# Patient Record
Sex: Male | Born: 1980 | Race: White | Hispanic: No | Marital: Married | Smoking: Never smoker
Health system: Southern US, Community
[De-identification: ages and names within clinical notes are randomized; demographics above are authoritative.]

## PROBLEM LIST (undated history)

## (undated) DIAGNOSIS — S83105A Unspecified dislocation of left knee, initial encounter: Secondary | ICD-10-CM

## (undated) DIAGNOSIS — F418 Other specified anxiety disorders: Secondary | ICD-10-CM

## (undated) DIAGNOSIS — E669 Obesity, unspecified: Secondary | ICD-10-CM

## (undated) HISTORY — DX: Obesity, unspecified: E66.9

## (undated) HISTORY — DX: Other specified anxiety disorders: F41.8

## (undated) HISTORY — DX: Unspecified dislocation of left knee, initial encounter: S83.105A

## (undated) HISTORY — PX: TONSILLECTOMY: SUR1361

---
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
## 2002-10-04 ENCOUNTER — Emergency Department (HOSPITAL_COMMUNITY): Admission: EM | Admit: 2002-10-04 | Discharge: 2002-10-04 | Payer: Self-pay | Admitting: Emergency Medicine

## 2005-10-16 ENCOUNTER — Emergency Department (HOSPITAL_COMMUNITY): Admission: EM | Admit: 2005-10-16 | Discharge: 2005-10-17 | Payer: Self-pay | Admitting: Emergency Medicine

## 2006-11-19 ENCOUNTER — Emergency Department (HOSPITAL_COMMUNITY): Admission: EM | Admit: 2006-11-19 | Discharge: 2006-11-19 | Payer: Self-pay | Admitting: Emergency Medicine

## 2007-02-03 ENCOUNTER — Emergency Department (HOSPITAL_COMMUNITY): Admission: EM | Admit: 2007-02-03 | Discharge: 2007-02-03 | Payer: Self-pay | Admitting: Family Medicine

## 2009-02-09 ENCOUNTER — Emergency Department (HOSPITAL_COMMUNITY): Admission: EM | Admit: 2009-02-09 | Discharge: 2009-02-09 | Payer: Self-pay | Admitting: Emergency Medicine

## 2012-10-12 ENCOUNTER — Emergency Department (HOSPITAL_COMMUNITY): Payer: BC Managed Care – PPO

## 2012-10-12 ENCOUNTER — Encounter (HOSPITAL_COMMUNITY): Payer: Self-pay | Admitting: Nurse Practitioner

## 2012-10-12 ENCOUNTER — Emergency Department (HOSPITAL_COMMUNITY)
Admission: EM | Admit: 2012-10-12 | Discharge: 2012-10-12 | Disposition: A | Discharge status: Home / Self Care | Payer: BC Managed Care – PPO | Attending: Emergency Medicine | Admitting: Emergency Medicine

## 2012-10-12 DIAGNOSIS — K6289 Other specified diseases of anus and rectum: Secondary | ICD-10-CM | POA: Insufficient documentation

## 2012-10-12 DIAGNOSIS — R1032 Left lower quadrant pain: Secondary | ICD-10-CM

## 2012-10-12 DIAGNOSIS — Z881 Allergy status to other antibiotic agents status: Secondary | ICD-10-CM | POA: Insufficient documentation

## 2012-10-12 DIAGNOSIS — K625 Hemorrhage of anus and rectum: Secondary | ICD-10-CM | POA: Insufficient documentation

## 2012-10-12 LAB — URINALYSIS, ROUTINE W REFLEX MICROSCOPIC
Bilirubin Urine: NEGATIVE
Hgb urine dipstick: NEGATIVE
Ketones, ur: NEGATIVE mg/dL
Protein, ur: NEGATIVE mg/dL
Urobilinogen, UA: 0.2 mg/dL (ref 0.0–1.0)

## 2012-10-12 LAB — CBC WITH DIFFERENTIAL/PLATELET
Eosinophils Relative: 2 % (ref 0–5)
Hemoglobin: 16.7 g/dL (ref 13.0–17.0)
Lymphocytes Relative: 37 % (ref 12–46)
Lymphs Abs: 3.3 10*3/uL (ref 0.7–4.0)
MCH: 31.8 pg (ref 26.0–34.0)
MCV: 88 fL (ref 78.0–100.0)
Monocytes Relative: 8 % (ref 3–12)
Platelets: 259 10*3/uL (ref 150–400)
RBC: 5.25 MIL/uL (ref 4.22–5.81)
WBC: 8.9 10*3/uL (ref 4.0–10.5)

## 2012-10-12 LAB — COMPREHENSIVE METABOLIC PANEL
ALT: 23 U/L (ref 0–53)
Alkaline Phosphatase: 74 U/L (ref 39–117)
BUN: 14 mg/dL (ref 6–23)
CO2: 25 mEq/L (ref 19–32)
Calcium: 9.5 mg/dL (ref 8.4–10.5)
GFR calc Af Amer: >90 mL/min (ref >90)
GFR calc non Af Amer: >90 mL/min (ref >90)
Glucose, Bld: 109 mg/dL — ABNORMAL HIGH (ref 70–99)
Potassium: 3.8 mEq/L (ref 3.5–5.1)
Sodium: 136 mEq/L (ref 135–145)

## 2012-10-12 LAB — LIPASE, BLOOD: Lipase: 20 U/L (ref 11–59)

## 2012-10-12 MED ORDER — HYDROCODONE-ACETAMINOPHEN 5-325 MG PO TABS: 1.0000 | ORAL_TABLET | Freq: Four times a day (QID) | ORAL | Status: DC | PRN

## 2012-10-12 MED ORDER — IOHEXOL 300 MG/ML  SOLN
100.0000 mL | Freq: Once | INTRAMUSCULAR | Status: AC | PRN
  Administered 2012-10-12: 100 mL via INTRAVENOUS

## 2012-10-12 MED ORDER — ONDANSETRON HCL 4 MG PO TABS: 4.0000 mg | ORAL_TABLET | Freq: Three times a day (TID) | ORAL | Status: DC | PRN

## 2012-10-12 NOTE — ED Provider Notes (Signed)
I saw and evaluated the patient, reviewed the resident's note and I agree with the findings and plan.   .Face to face Exam:  General:  Awake HEENT:  Atraumatic Resp:  Normal effort Abd:  Nondistended Neuro:No focal weakness  Nelia Shi, MD 10/12/12 [phone]

## 2012-10-12 NOTE — ED Provider Notes (Signed)
CSN: [id number]     Arrival date & time 10/12/12  1150 History     First MD Initiated Contact with Patient 10/12/12 1152     Chief Complaint  Patient presents with  . Rectal Bleeding   HPI 32 year old male presents with reports of blood stool and associated abdominal pain.  He reports that last night around 2 am, who woke up with moderately severe left lower abdominal pain.  This slowly improved and he went back to sleep.  When he got up again, he used the restroom and noticed dark red blood in the toilet.  Concerned, he decide to go to the ED for further evaluation.  Abdominal pain is located in the LLQ and is non radiating. He denies any exacerbating or relieving factors.  No associated constipation, diarrhea, N/V, chest pain or SOB.  Denies any prior history of abdominal pain and associated rectal bleeding.  Denies any changes in his diet or new medications.  He also denies any family history of UC or Crohn's disease.  History reviewed. No pertinent past medical history. Past Surgical History  Procedure Laterality Date  . Replacement total knee Left 1997   History reviewed. No pertinent family history. History  Substance Use Topics  . Smoking status: Never Smoker   . Smokeless tobacco: Not on file  . Alcohol Use: Yes    Review of Systems  Constitutional: Negative for fever, chills and appetite change.  Respiratory: Negative for shortness of breath.   Cardiovascular: Negative for chest pain.  Gastrointestinal: Positive for abdominal pain, blood in stool and rectal pain. Negative for nausea, vomiting, diarrhea, constipation and abdominal distention.  Neurological: Negative for dizziness and light-headedness.    Allergies  Ceclor  Home Medications  No current outpatient prescriptions on file. BP 144/86  Pulse 88  Temp(Src) 98.1 F (36.7 C) (Oral)  Resp 18  SpO2 97% Physical Exam  Constitutional: He is oriented to person, place, and time. He appears well-developed and  well-nourished.  HENT:  Head: Normocephalic and atraumatic.  Cardiovascular: Normal rate and regular rhythm.   No murmur heard. Pulmonary/Chest: Effort normal. He has no wheezes. He has no rales.  Abdominal: Soft. Bowel sounds are normal. He exhibits no distension and no mass. There is tenderness. There is no rebound and no guarding.  Mildly Tender to palpation LLQ  Genitourinary:  Rectal exam: No hemorrhoids noted. Perianal erythema with a few small areas of ulceration noted.  Stool Guaiac obtained.   Musculoskeletal: He exhibits no edema.  Neurological: He is alert and oriented to person, place, and time.  Skin: Skin is warm and dry.    ED Course   Procedures (including critical care time)  CBC    Component Value Date/Time   WBC 8.9 10/12/2012 1217   RBC 5.25 10/12/2012 1217   HGB 16.7 10/12/2012 1217   HCT 46.2 10/12/2012 1217   PLT 259 10/12/2012 1217   MCV 88.0 10/12/2012 1217   MCH 31.8 10/12/2012 1217   MCHC 36.1* 10/12/2012 1217   RDW 12.9 10/12/2012 1217   LYMPHSABS 3.3 10/12/2012 1217   MONOABS 0.7 10/12/2012 1217   EOSABS 0.1 10/12/2012 1217   BASOSABS 0.0 10/12/2012 1217   CMP     Component Value Date/Time   NA 136 10/12/2012 1217   K 3.8 10/12/2012 1217   CL 100 10/12/2012 1217   CO2 25 10/12/2012 1217   GLUCOSE 109* 10/12/2012 1217   BUN 14 10/12/2012 1217   CREATININE 0.88 10/12/2012 1217  CALCIUM 9.5 10/12/2012 1217   PROT 7.9 10/12/2012 1217   ALBUMIN 4.2 10/12/2012 1217   AST 19 10/12/2012 1217   ALT 23 10/12/2012 1217   ALKPHOS 74 10/12/2012 1217   BILITOT 1.3* 10/12/2012 1217   GFRNONAA >90 10/12/2012 1217   GFRAA >90 10/12/2012 1217   Lipase     Component Value Date/Time   LIPASE 20 10/12/2012 1217   Ct Abdomen Pelvis W Contrast 10/12/2012   *RADIOLOGY REPORT*  Clinical Data: Rectal bleeding, abdominal pain, constipation  CT ABDOMEN AND PELVIS WITH CONTRAST  Technique:  Multidetector CT imaging of the abdomen and pelvis was performed following the standard protocol during bolus administration  of intravenous contrast.  Contrast: OMNIPAQUE IOHEXOL 300 MG/ML  SOLN  Comparison: None.  Findings: Lung bases are clear.  Liver is notable for focal fat/altered perfusion along the falciform ligament.  Spleen, pancreas, and adrenal glands within normal limits.  Gallbladder is unremarkable.  No intrahepatic or extrahepatic ductal dilatation.  Kidneys are within normal limits.  No hydronephrosis.  No evidence of bowel obstruction.  Normal appendix.  No colonic wall thickening or inflammatory changes.  No evidence of abdominal aortic aneurysm.  No abdominopelvic ascites.  No suspicious abdominopelvic lymphadenopathy.  Prostate is unremarkable.  Bladder is within normal limits.  Visualized osseous structures are within normal limits.  IMPRESSION: No evidence of bowel obstruction.  Normal appendix.  No colonic wall thickening or inflammatory changes.  No CT findings to account for the patient's GI bleeding.    No results found. No diagnosis found.  MDM  32 year old male presents with LLQ and recent hematochezia. - Hemacult Negative. CBC was unremarkable with normal H/H.  CMP unremarkable as well.  - CT abdomen/pelvis obtained; No findings to account for patient's GI bleeding (see report above). - Patient stable.  Will discharge home with Zofran and PRN Vicodin.  Patient to follow up with [facility].   Tommie Sams, DO 10/12/12 1400

## 2012-10-12 NOTE — ED Notes (Signed)
Pt reports abd cramping throughout the night last night, then this morning when he got up to have a bowel movement he noticed dark red blood in toilet with his stool. Reports a history of constipation but has not recently been constipated. Denies n/v.

## 2012-10-12 NOTE — Discharge Instructions (Signed)
Please call [facility] and set up a follow up appointment.   Abdominal Pain Abdominal pain can be caused by many things. Your caregiver decides the seriousness of your pain by an examination and possibly blood tests and X-rays. Many cases can be observed and treated at home. Most abdominal pain is not caused by a disease and will probably improve without treatment. However, in many cases, more time must pass before a clear cause of the pain can be found. Before that point, it may not be known if you need more testing, or if hospitalization or surgery is needed. HOME CARE INSTRUCTIONS   Do not take laxatives unless directed by your caregiver.  Take pain medicine only as directed by your caregiver.  Only take over-the-counter or prescription medicines for pain, discomfort, or fever as directed by your caregiver.  Try a clear liquid diet (broth, tea, or water) for as long as directed by your caregiver. Slowly move to a bland diet as tolerated. SEEK IMMEDIATE MEDICAL CARE IF:   The pain does not go away.  You have a fever.  You keep throwing up (vomiting).  The pain is felt only in portions of the abdomen. Pain in the right side could possibly be appendicitis. In an adult, pain in the left lower portion of the abdomen could be colitis or diverticulitis.  You pass bloody or black tarry stools. MAKE SURE YOU:   Understand these instructions.  Will watch your condition.  Will get help right away if you are not doing well or get worse. Document Released: 12/06/2004 Document Revised: 05/21/2011 Document Reviewed: 10/15/2007 [facility] Patient Information 2014 [place], [place].  Bloody Stools Bloody stools often mean that there is a problem in the digestive tract. Your caregiver may use the term "melena" to describe black, tarry, and bad smelling stools or "hematochezia" to describe red or maroon-colored stools. Blood seen in the stool can be caused by bleeding anywhere along the  intestinal tract.  A black stool usually means that blood is coming from the upper part of the gastrointestinal tract (esophagus, stomach, or small bowel). Passing maroon-colored stools or bright red blood usually means that blood is coming from lower down in the large bowel or the rectum. However, sometimes massive bleeding in the stomach or small intestine can cause bright red bloody stools.  Consuming black licorice, lead, iron pills, medicines containing bismuth subsalicylate, or blueberries can also cause black stools. Your caregiver can test black stools to see if blood is present. It is important that the cause of the bleeding be found. Treatment can then be started, and the problem can be corrected. Rectal bleeding may not be serious, but you should not assume everything is okay until you know the cause.It is very important to follow up with your caregiver or a specialist in gastrointestinal problems. CAUSES  Blood in the stools can come from various underlying causes.Often, the cause is not found during your first visit. Testing is often needed to discover the cause of bleeding in the gastrointestinal tract. Causes range from simple to serious or even life-threatening.Possible causes include:  Hemorrhoids.These are veins that are full of blood (engorged) in the rectum. They cause pain, inflammation, and may bleed.  Anal fissures.These are areas of painful tearing which may bleed. They are often caused by passing hard stool.  Diverticulosis.These are pouches that form on the colon over time, with age, and may bleed significantly.  Diverticulitis.This is inflammation in areas with diverticulosis. It can cause pain, fever,  and bloody stools, although bleeding is rare.  Proctitis and colitis. These are inflamed areas of the rectum or colon. They may cause pain, fever, and bloody stools.  Polyps and cancer. Colon cancer is a leading cause of preventable cancer death.It often starts out  as precancerous polyps that can be removed during a colonoscopy, preventing progression into cancer. Sometimes, polyps and cancer may cause rectal bleeding.  Gastritis and ulcers.Bleeding from the upper gastrointestinal tract (near the stomach) may travel through the intestines and produce black, sometimes tarry, often bad smelling stools. In certain cases, if the bleeding is fast enough, the stools may not be black, but red and the condition may be life-threatening. SYMPTOMS  You may have stools that are bright red and bloody, that are normal color with blood on them, or that are dark black and tarry. In some cases, you may only have blood in the toilet bowl. Any of these cases need medical care. You may also have:  Pain at the anus or anywhere in the rectum.  Lightheadedness or feeling faint.  Extreme weakness.  Nausea or vomiting.  Fever. DIAGNOSIS Your caregiver may use the following methods to find the cause of your bleeding:  Taking a medical history. Age is important. Older people tend to develop polyps and cancer more often. If there is anal pain and a hard, large stool associated with bleeding, a tear of the anus may be the cause. If blood drips into the toilet after a bowel movement, bleeding hemorrhoids may be the problem. The color and frequency of the bleeding are additional considerations. In most cases, the medical history provides clues, but seldom the final answer.  A visual and finger (digital) exam. Your caregiver will inspect the anal area, looking for tears and hemorrhoids. A finger exam can provide information when there is tenderness or a growth inside. In men, the prostate is also examined.  Endoscopy. Several types of small, long scopes (endoscopes) are used to view the colon.  In the office, your caregiver may use a rigid, or more commonly, a flexible viewing sigmoidoscope. This exam is called flexible sigmoidoscopy. It is performed in 5 to 10 minutes.  A more  thorough exam is accomplished with a colonoscope. It allows your caregiver to view the entire 5 to 6 foot long colon. Medicine to help you relax (sedative) is usually given for this exam. Frequently, a bleeding lesion may be present beyond the reach of the sigmoidoscope. So, a colonoscopy may be the best exam to start with. Both exams are usually done on an outpatient basis. This means the patient does not stay overnight in the hospital or surgery center.  An upper endoscopy may be needed to examine your stomach. Sedation is used and a flexible endoscope is put in your mouth, down to your stomach.  A barium enema X-ray. This is an X-ray exam. It uses liquid barium inserted by enema into the rectum. This test alone may not identify an actual bleeding point. X-rays highlight abnormal shadows, such as those made by lumps (tumors), diverticuli, or colitis. TREATMENT  Treatment depends on the cause of your bleeding.   For bleeding from the stomach or colon, the caregiver doing your endoscopy or colonoscopy may be able to stop the bleeding as part of the procedure.  Inflammation or infection of the colon can be treated with medicines.  Many rectal problems can be treated with creams, suppositories, or warm baths.  Surgery is sometimes needed.  Blood transfusions are sometimes needed  if you have lost a lot of blood.  For any bleeding problem, let your caregiver know if you take aspirin or other blood thinners regularly. HOME CARE INSTRUCTIONS   Take any medicines exactly as prescribed.  Keep your stools soft by eating a diet high in fiber. Prunes (1 to 3 a day) work well for many people.  Drink enough water and fluids to keep your urine clear or pale yellow.  Take sitz baths if advised. A sitz bath is when you sit in a bathtub with warm water for 10 to 15 minutes to soak, soothe, and cleanse the rectal area.  If enemas or suppositories are advised, be sure you know how to use them. Tell your  caregiver if you have problems with this.  Monitor your bowel movements to look for signs of improvement or worsening. SEEK MEDICAL CARE IF:   You do not improve in the time expected.  Your condition worsens after initial improvement.  You develop any new symptoms. SEEK IMMEDIATE MEDICAL CARE IF:   You develop severe or prolonged rectal bleeding.  You vomit blood.  You feel weak or faint.  You have a fever. MAKE SURE YOU:  Understand these instructions.  Will watch your condition.  Will get help right away if you are not doing well or get worse. Document Released: 02/16/2002 Document Revised: 05/21/2011 Document Reviewed: 07/14/2010 [facility] Patient Information 2014 [place], [place].

## 2012-10-12 NOTE — ED Notes (Signed)
Pt sts around 2am he woke up with a really bad stomach ache, had abd pain the rest of the night, felt constipated. This morning he drank some coffee and then used the restroom, notice the toliet bowl with filled with dark red bld. Pt sts now the abd pain has return, sts it is in the left lower quadrant. Sts no hx of diverticulosis/rectal bleeding. Pt in nad, skin warm and dry, resp e/u.

## 2015-03-13 DIAGNOSIS — E559 Vitamin D deficiency, unspecified: Secondary | ICD-10-CM

## 2015-03-13 HISTORY — DX: Vitamin D deficiency, unspecified: E55.9

## 2015-03-30 IMAGING — CT CT ABD-PELV W/ CM
2 of 4 series · 16 of 46 positions shown, 18 images · IV contrast (omnipaque)
Comparison: None.

CLINICAL DATA: Rectal bleeding, abdominal pain, constipation

CT ABDOMEN AND PELVIS WITH CONTRAST
TECHNIQUE: Multidetector CT imaging of the abdomen and pelvis was
performed following the standard protocol during bolus
administration of intravenous contrast.
Contrast: 100mL OMNIPAQUE IOHEXOL 300 MG/ML  SOLN

[Series 2: abd/ pelvis 5.0 i30f 1 · axial · 0.98mm/px · z∈[+878,+1353]mm · 13 of 105 slices shown, 15 images]
[im 5/105  soft-tissue]
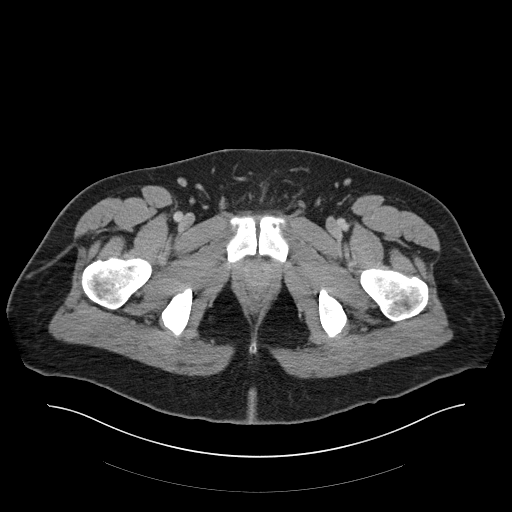
[im 5/105  bone]
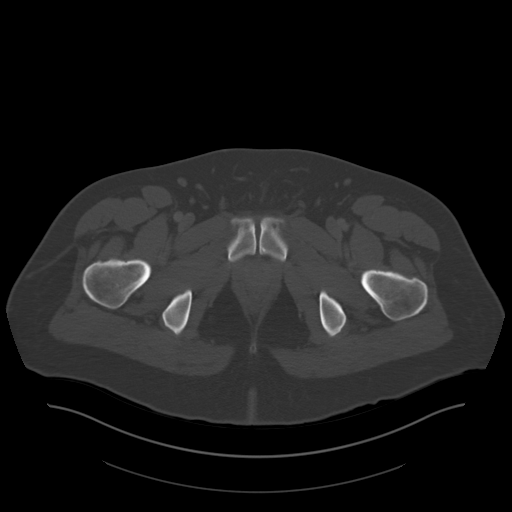
[im 15/105  soft-tissue]
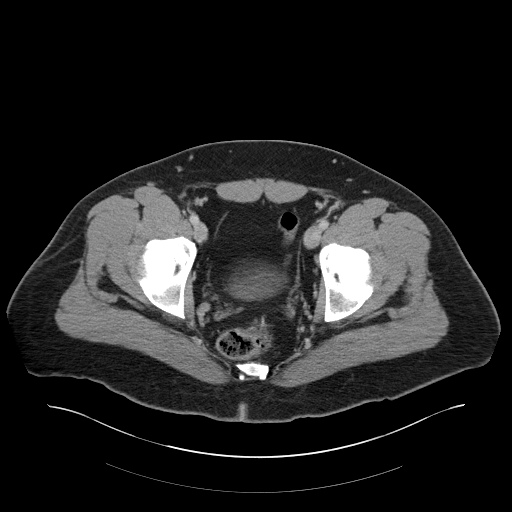
[im 24/105  soft-tissue]
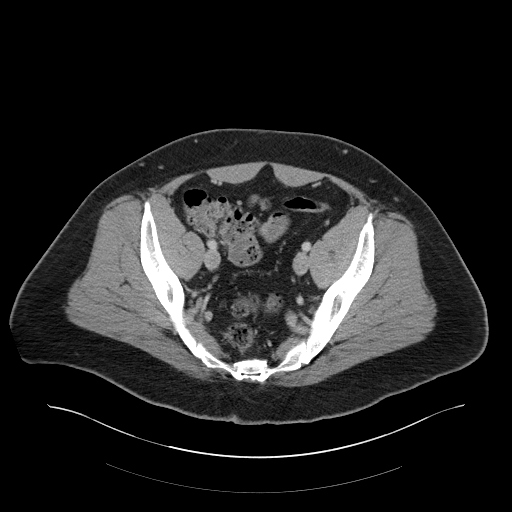
[im 29/105  soft-tissue]
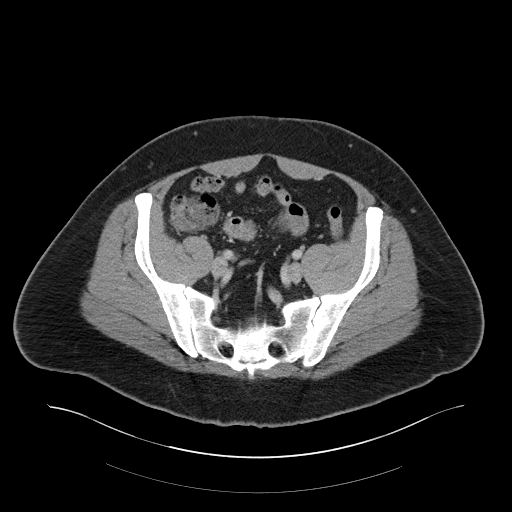
[im 38/105  soft-tissue]
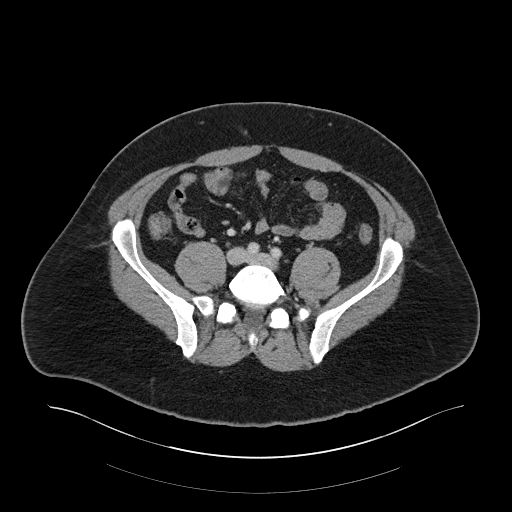
[im 43/105  soft-tissue]
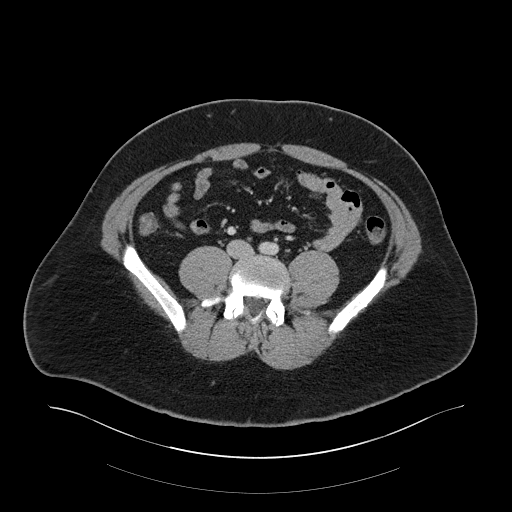
[im 53/105  soft-tissue]
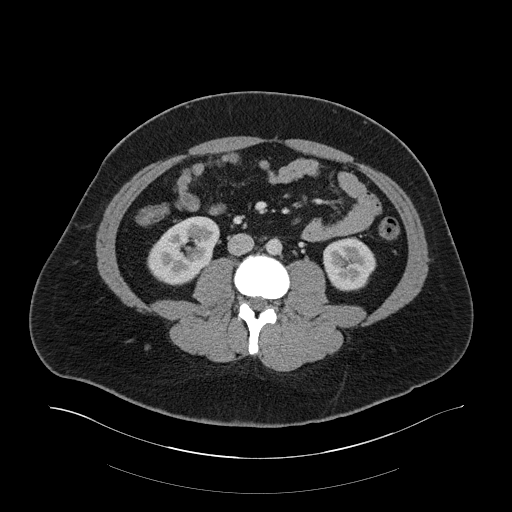
[im 62/105  soft-tissue]
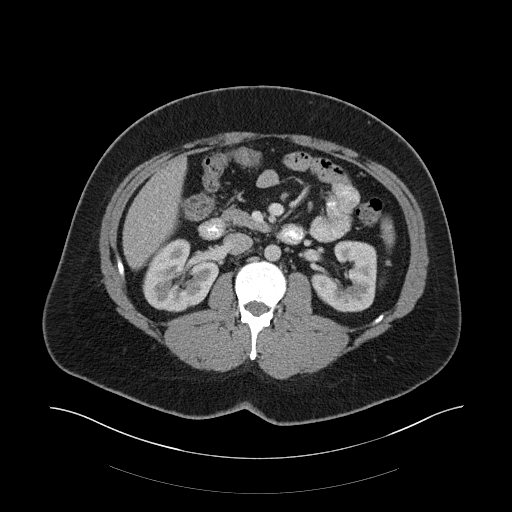
[im 67/105  soft-tissue]
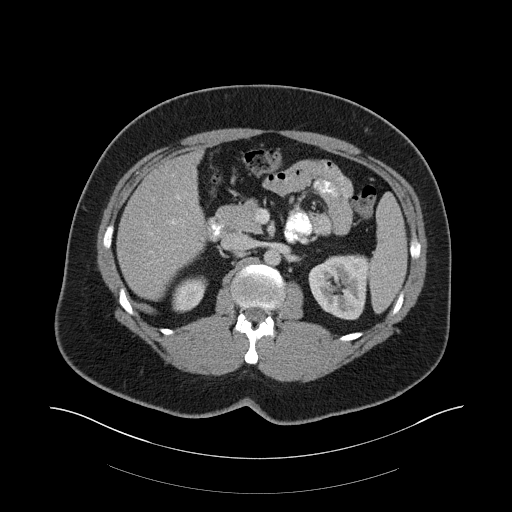
[im 67/105  bone]
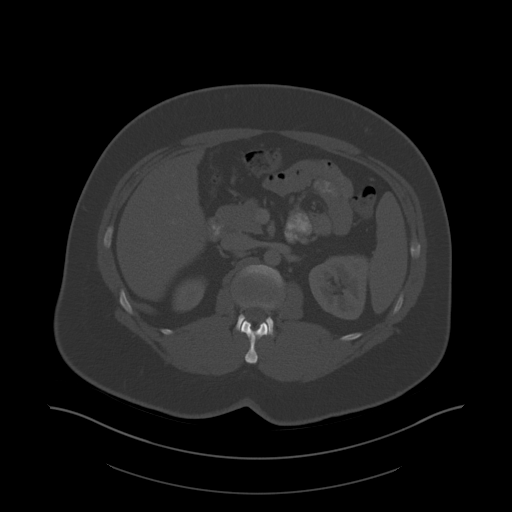
[im 76/105  soft-tissue]
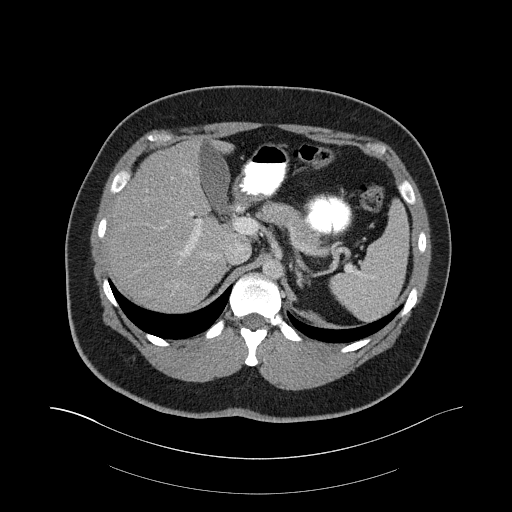
[im 81/105  soft-tissue]
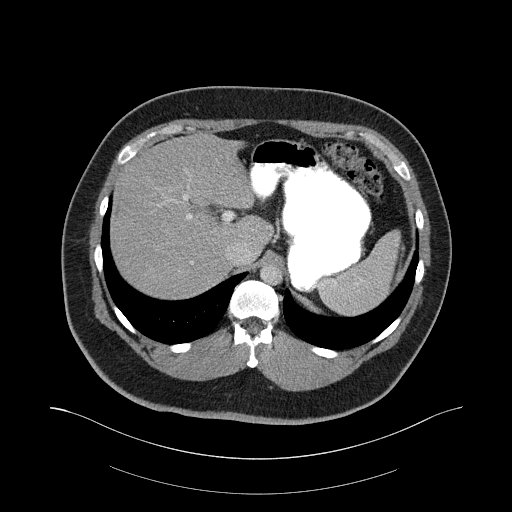
[im 90/105  soft-tissue]
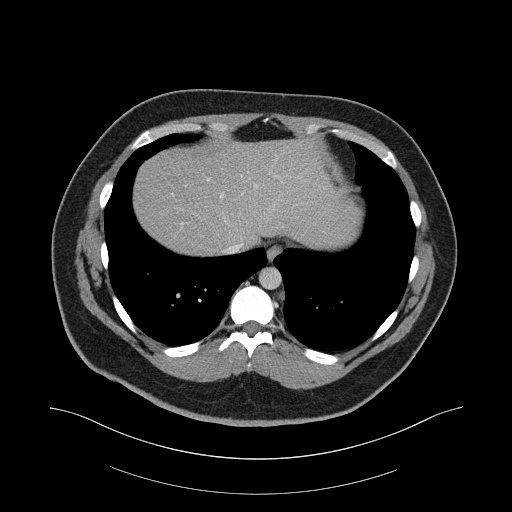
[im 100/105  soft-tissue]
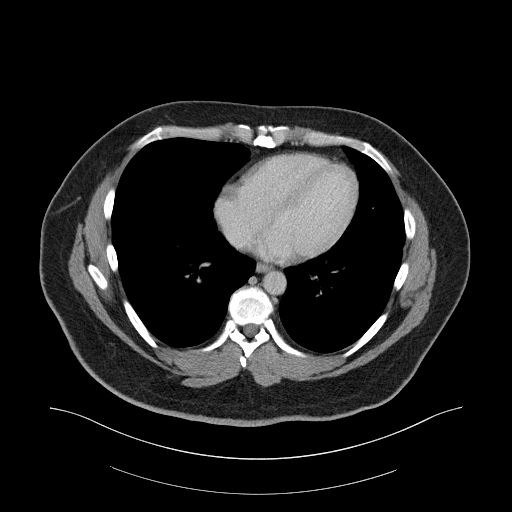

[Series 5: cor · coronal · 0.68mm/px · 3 of 120 slices shown]
[im 40/120  soft-tissue]
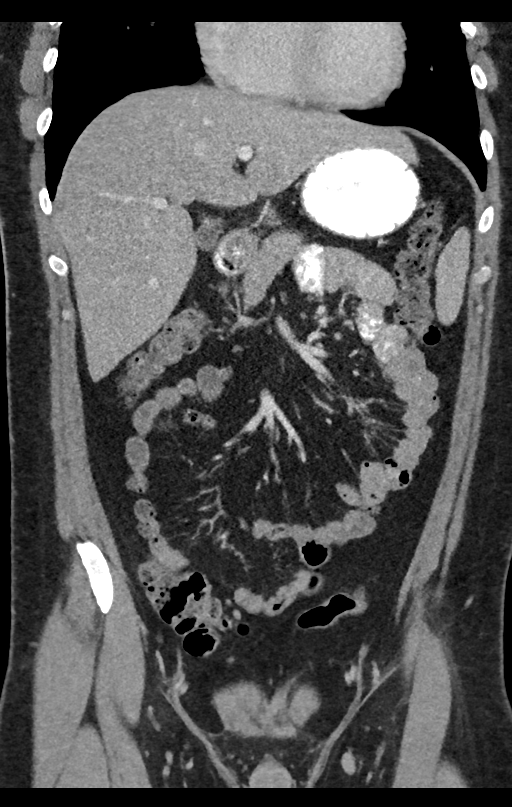
[im 53/120  soft-tissue]
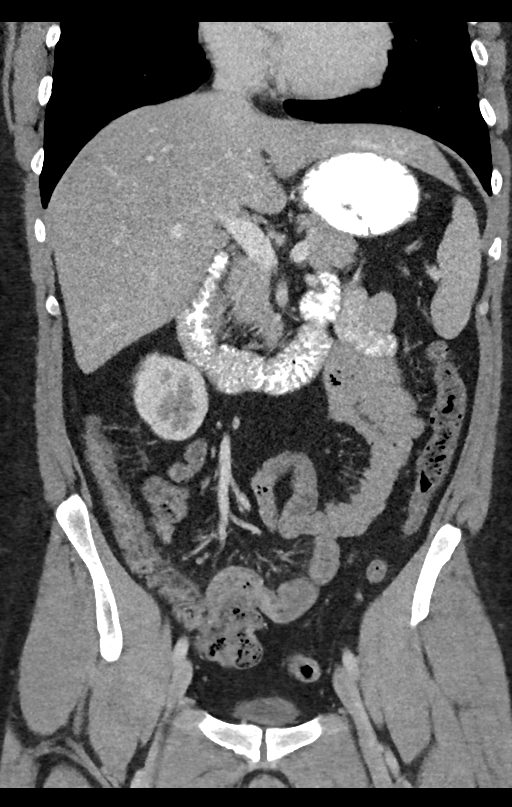
[im 67/120  soft-tissue]
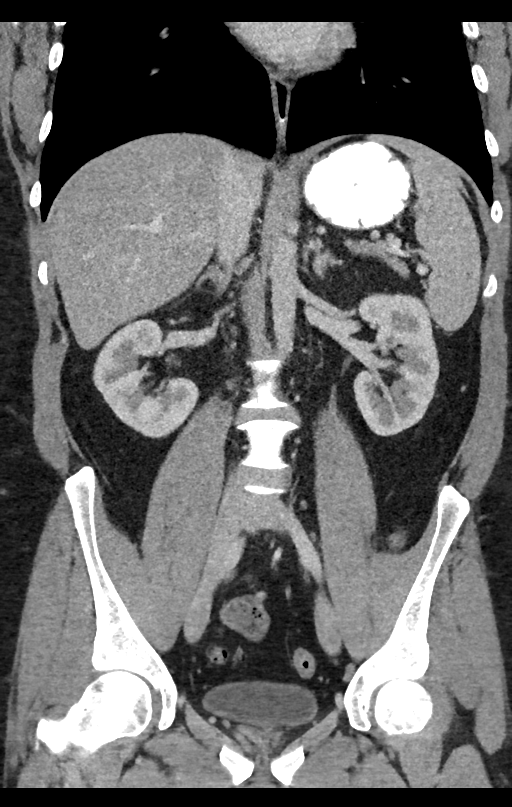

[16 of 46 positions shown; findings below may reference images not displayed]

FINDINGS: Lung bases are clear.

Liver is notable for focal fat/altered perfusion along the
falciform ligament.

Spleen, pancreas, and adrenal glands within normal limits.

Gallbladder is unremarkable.  No intrahepatic or extrahepatic
ductal dilatation.

Kidneys are within normal limits.  No hydronephrosis.

No evidence of bowel obstruction.  Normal appendix.  No colonic
wall thickening or inflammatory changes.

No evidence of abdominal aortic aneurysm.

No abdominopelvic ascites.

No suspicious abdominopelvic lymphadenopathy.

Prostate is unremarkable.

Bladder is within normal limits.

Visualized osseous structures are within normal limits.
IMPRESSION: No evidence of bowel obstruction.  Normal appendix.

No colonic wall thickening or inflammatory changes.

No CT findings to account for the patient's GI bleeding.

## 2015-06-20 DIAGNOSIS — H5213 Myopia, bilateral: Secondary | ICD-10-CM | POA: Diagnosis not present

## 2016-04-12 DIAGNOSIS — E559 Vitamin D deficiency, unspecified: Secondary | ICD-10-CM | POA: Diagnosis not present

## 2016-04-12 DIAGNOSIS — R8299 Other abnormal findings in urine: Secondary | ICD-10-CM | POA: Diagnosis not present

## 2016-04-12 DIAGNOSIS — Z0000 Encounter for general adult medical examination without abnormal findings: Secondary | ICD-10-CM | POA: Diagnosis not present

## 2016-04-17 DIAGNOSIS — F329 Major depressive disorder, single episode, unspecified: Secondary | ICD-10-CM | POA: Diagnosis not present

## 2016-04-17 DIAGNOSIS — Z1389 Encounter for screening for other disorder: Secondary | ICD-10-CM | POA: Diagnosis not present

## 2016-04-17 DIAGNOSIS — E668 Other obesity: Secondary | ICD-10-CM | POA: Diagnosis not present

## 2016-04-17 DIAGNOSIS — R5383 Other fatigue: Secondary | ICD-10-CM | POA: Diagnosis not present

## 2016-04-17 DIAGNOSIS — Z0000 Encounter for general adult medical examination without abnormal findings: Secondary | ICD-10-CM | POA: Diagnosis not present

## 2016-04-17 DIAGNOSIS — E559 Vitamin D deficiency, unspecified: Secondary | ICD-10-CM | POA: Diagnosis not present

## 2017-02-27 DIAGNOSIS — R05 Cough: Secondary | ICD-10-CM | POA: Diagnosis not present

## 2017-02-27 DIAGNOSIS — J029 Acute pharyngitis, unspecified: Secondary | ICD-10-CM | POA: Diagnosis not present

## 2017-02-27 DIAGNOSIS — J069 Acute upper respiratory infection, unspecified: Secondary | ICD-10-CM | POA: Diagnosis not present

## 2017-03-03 ENCOUNTER — Ambulatory Visit: Payer: Self-pay | Admitting: Nurse Practitioner

## 2017-03-03 VITALS — BP 126/84 | HR 78 | Temp 98.5°F | Resp 18

## 2017-03-03 DIAGNOSIS — J0100 Acute maxillary sinusitis, unspecified: Secondary | ICD-10-CM

## 2017-03-03 DIAGNOSIS — J209 Acute bronchitis, unspecified: Secondary | ICD-10-CM

## 2017-03-03 MED ORDER — DOXYCYCLINE HYCLATE 100 MG PO TABS: 100.0000 mg | ORAL_TABLET | Freq: Two times a day (BID) | ORAL | 0 refills | Status: AC

## 2017-03-03 MED ORDER — PREDNISONE 20 MG PO TABS
20.0000 mg | ORAL_TABLET | Freq: Every day | ORAL | 0 refills | Status: AC
Start: 2017-03-03 — End: 2017-03-06

## 2017-03-03 MED ORDER — FLUTICASONE PROPIONATE 50 MCG/ACT NA SUSP: 2.0000 | Freq: Every day | NASAL | 0 refills | Status: DC

## 2017-03-03 MED ORDER — BENZONATATE 100 MG PO CAPS: 100.0000 mg | ORAL_CAPSULE | Freq: Two times a day (BID) | ORAL | 0 refills | Status: AC | PRN

## 2017-03-03 NOTE — Patient Instructions (Addendum)
Sinusitis, Adult Sinusitis is soreness and inflammation of your sinuses. Sinuses are hollow spaces in the bones around your face. Your sinuses are located:  Around your eyes.  In the middle of your forehead.  Behind your nose.  In your cheekbones.  Your sinuses and nasal passages are lined with a stringy fluid (mucus). Mucus normally drains out of your sinuses. When your nasal tissues become inflamed or swollen, the mucus can become trapped or blocked so air cannot flow through your sinuses. This allows bacteria, viruses, and funguses to grow, which leads to infection. Sinusitis can develop quickly and last for 7?10 days (acute) or for more than 12 weeks (chronic). Sinusitis often develops after a cold. What are the causes? This condition is caused by anything that creates swelling in the sinuses or stops mucus from draining, including:  Allergies.  Asthma.  Bacterial or viral infection.  Abnormally shaped bones between the nasal passages.  Nasal growths that contain mucus (nasal polyps).  Narrow sinus openings.  Pollutants, such as chemicals or irritants in the air.  A foreign object stuck in the nose.  A fungal infection. This is rare.  What increases the risk? The following factors may make you more likely to develop this condition:  Having allergies or asthma.  Having had a recent cold or respiratory tract infection.  Having structural deformities or blockages in your nose or sinuses.  Having a weak immune system.  Doing a lot of swimming or diving.  Overusing nasal sprays.  Smoking.  What are the signs or symptoms? The main symptoms of this condition are pain and a feeling of pressure around the affected sinuses. Other symptoms include:  Upper toothache.  Earache.  Headache.  Bad breath.  Decreased sense of smell and taste.  A cough that may get worse at night.  Fatigue.  Fever.  Thick drainage from your nose. The drainage is often green and  it may contain pus (purulent).  Stuffy nose or congestion.  Postnasal drip. This is when extra mucus collects in the throat or back of the nose.  Swelling and warmth over the affected sinuses.  Sore throat.  Sensitivity to light.  How is this diagnosed? This condition is diagnosed based on symptoms, a medical history, and a physical exam. To find out if your condition is acute or chronic, your health care provider may:  Look in your nose for signs of nasal polyps.  Tap over the affected sinus to check for signs of infection.  View the inside of your sinuses using an imaging device that has a light attached (endoscope).  If your health care provider suspects that you have chronic sinusitis, you may also:  Be tested for allergies.  Have a sample of mucus taken from your nose (nasal culture) and checked for bacteria.  Have a mucus sample examined to see if your sinusitis is related to an allergy.  If your sinusitis does not respond to treatment and it lasts longer than 8 weeks, you may have an MRI or CT scan to check your sinuses. These scans also help to determine how severe your infection is. In rare cases, a bone biopsy may be done to rule out more serious types of fungal sinus disease. How is this treated? Treatment for sinusitis depends on the cause and whether your condition is chronic or acute. If a virus is causing your sinusitis, your symptoms will go away on their own within 10 days. You may be given medicines to relieve your symptoms,   including:  Topical nasal decongestants. They shrink swollen nasal passages and let mucus drain from your sinuses.  Antihistamines. These drugs block inflammation that is triggered by allergies. This can help to ease swelling in your nose and sinuses.  Topical nasal corticosteroids. These are nasal sprays that ease inflammation and swelling in your nose and sinuses.  Nasal saline washes. These rinses can help to get rid of thick mucus in  your nose.  If your condition is caused by bacteria, you will be given an antibiotic medicine. If your condition is caused by a fungus, you will be given an antifungal medicine. Surgery may be needed to correct underlying conditions, such as narrow nasal passages. Surgery may also be needed to remove polyps. Follow these instructions at home: Medicines  Take, use, or apply over-the-counter and prescription medicines only as told by your health care provider. These may include nasal sprays.  If you were prescribed an antibiotic medicine, take it as told by your health care provider. Do not stop taking the antibiotic even if you start to feel better. Hydrate and Humidify  Drink enough water to keep your urine clear or pale yellow. Staying hydrated will help to thin your mucus.  Use a cool mist humidifier to keep the humidity level in your home above 50%.  Inhale steam for 10-15 minutes, 3-4 times a day or as told by your health care provider. You can do this in the bathroom while a hot shower is running.  Limit your exposure to cool or dry air. Rest  Rest as much as possible.  Sleep with your head raised (elevated).  Make sure to get enough sleep each night. General instructions  Apply a warm, moist washcloth to your face 3-4 times a day or as told by your health care provider. This will help with discomfort.  Wash your hands often with soap and water to reduce your exposure to viruses and other germs. If soap and water are not available, use hand sanitizer.  Do not smoke. Avoid being around people who are smoking (secondhand smoke).  Keep all follow-up visits as told by your health care provider. This is important.  Ibuprofen or Tylenol for pain, fever or general discomfort. Contact a health care provider if:  You have a fever.  Your symptoms get worse.  Your symptoms do not improve within 10 days. Get help right away if:  You have a severe headache.  You have  persistent vomiting.  You have pain or swelling around your face or eyes.  You have vision problems.  You develop confusion.  Your neck is stiff.  You have trouble breathing. This information is not intended to replace advice given to you by your health care provider. Make sure you discuss any questions you have with your health care provider. Document Released: 02/26/2005 Document Revised: 10/23/2015 Document Reviewed: 12/22/2014 Elsevier Interactive Patient Education  2018 [employer]  Acute Bronchitis, Adult Acute bronchitis is sudden (acute) swelling of the air tubes (bronchi) in the lungs. Acute bronchitis causes these tubes to fill with mucus, which can make it hard to breathe. It can also cause coughing or wheezing. In adults, acute bronchitis usually goes away within 2 weeks. A cough caused by bronchitis may last up to 3 weeks. Smoking, allergies, and asthma can make the condition worse. Repeated episodes of bronchitis may cause further lung problems, such as chronic obstructive pulmonary disease (COPD). What are the causes? This condition can be caused by germs and by substances that  irritate the lungs, including:  Cold and flu viruses. This condition is most often caused by the same virus that causes a cold.  Bacteria.  Exposure to tobacco smoke, dust, fumes, and air pollution.  What increases the risk? This condition is more likely to develop in people who:  Have close contact with someone with acute bronchitis.  Are exposed to lung irritants, such as tobacco smoke, dust, fumes, and vapors.  Have a weak immune system.  Have a respiratory condition such as asthma.  What are the signs or symptoms? Symptoms of this condition include:  A cough.  Coughing up clear, yellow, or green mucus.  Wheezing.  Chest congestion.  Shortness of breath.  A fever.  Body aches.  Chills.  A sore throat.  How is this diagnosed? This condition is usually diagnosed  with a physical exam. During the exam, your health care provider may order tests, such as chest X-rays, to rule out other conditions. He or she may also:  Test a sample of your mucus for bacterial infection.  Check the level of oxygen in your blood. This is done to check for pneumonia.  Do a chest X-ray or lung function testing to rule out pneumonia and other conditions.  Perform blood tests.  Your health care provider will also ask about your symptoms and medical history. How is this treated? Most cases of acute bronchitis clear up over time without treatment. Your health care provider may recommend:  Drinking more fluids. Drinking more makes your mucus thinner, which may make it easier to breathe.  Taking a medicine for a fever or cough.  Taking an antibiotic medicine.  Using an inhaler to help improve shortness of breath and to control a cough.  Using a cool mist vaporizer or humidifier to make it easier to breathe.  Follow these instructions at home: Medicines  Take over-the-counter and prescription medicines only as told by your health care provider.   If you were prescribed an antibiotic, take it as told by your health care provider. Do not stop taking the antibiotic even if you start to feel better.  May use a teaspoon of honey to help with cough.  General instructions  Get plenty of rest.  Drink enough fluids to keep your urine clear or pale yellow.  Avoid smoking and secondhand smoke. Exposure to cigarette smoke or irritating chemicals will make bronchitis worse. If you smoke and you need help quitting, ask your health care provider. Quitting smoking will help your lungs heal faster.  Use an inhaler, cool mist vaporizer, or humidifier as told by your health care provider.  Keep all follow-up visits as told by your health care provider. This is important. How is this prevented? To lower your risk of getting this condition again:  Wash your hands often with soap  and water. If soap and water are not available, use hand sanitizer.  Avoid contact with people who have cold symptoms.  Try not to touch your hands to your mouth, nose, or eyes.  Make sure to get the flu shot every year.  Contact a health care provider if:  Your symptoms do not improve in 2 weeks of treatment. Get help right away if:  You cough up blood.  You have chest pain.  You have severe shortness of breath.  You become dehydrated.  You faint or keep feeling like you are going to faint.  You keep vomiting.  You have a severe headache.  Your fever or chills gets worse.  This information is not intended to replace advice given to you by your health care provider. Make sure you discuss any questions you have with your health care provider. Document Released: 04/05/2004 Document Revised: 09/21/2015 Document Reviewed: 08/17/2015 Elsevier Interactive Patient Education  [employer]2018 [employer]

## 2017-03-03 NOTE — Progress Notes (Signed)
Subjective:  Thomas Larsen is a 36 y.o. male who presents for evaluation of possible sinusitis.  Symptoms include nasal congestion, post nasal drip, productive cough with yellowish-green colored sputum, sinus pressure, sinus pain and sore throat.  Onset of symptoms was 10 days ago, and has been unchanged since that time.  Treatment to date:  cough suppressants and patient given recommendations for symptomatic treatment when seen at urgent care a couple of days ago..  High risk factors for influenza complications:  family has been ill..  Patient states he was seen at [facility] earlier this week, but treated for his symptoms only.  Patient states minimal improvement in symptoms.    The following portions of the patient's history were reviewed and updated as appropriate:  allergies, current medications and past medical history.  Constitutional: positive for fatigue and sweats Eyes: negative Ears, nose, mouth, throat, and face: positive for nasal congestion, sore throat and ear fullness, negative for ear drainage and earaches Respiratory: positive for cough and wheezing Cardiovascular: negative Neurological: positive for headaches Behavioral/Psych: negative Objective:  BP 126/84 (BP Location: Right Arm, Patient Position: Sitting, Cuff Size: Normal)   Pulse 78   Temp 98.5 F (36.9 C)   Resp 18   SpO2 98%  General appearance: alert, cooperative, fatigued and no distress Head: Normocephalic, without obvious abnormality, atraumatic Eyes: conjunctivae/corneas clear. PERRL, EOM's intact. Fundi benign. Ears: abnormal TM right ear - mucoid middle ear fluid and abnormal TM left ear - mucoid middle ear fluid Nose: turbinates edematous, inflamed, moderate maxillary sinus tenderness bilateral, no frontal sinus tenderness bilateral Throat: lips, mucosa, and tongue normal; teeth and gums normal Lungs: clear to auscultation bilaterally Heart: regular rate and rhythm, S1, S2 normal, no murmur, click, rub or  gallop Pulses: 2+ and symmetric Skin: Skin color, texture, turgor normal. No rashes or lesions Lymph nodes: Cervical, supraclavicular, and axillary nodes normal. and cervical and submandibular nodes normal Neurologic: Grossly normal    Assessment:  sinusitis and Acute Bronchitis    Plan:  Discussed diagnosis and treatment of sinusitis. Suggested symptomatic OTC remedies. Supportive care with appropriate antipyretics and fluids. Nasal saline spray for congestion. Doxycycline per orders. Nasal steroids per orders. Follow up as needed. Patient given three day course of Prednisone and also given Tessalon Perles for daytime.  Patient instructed to use Tylenol or Ibuprofen for pain, fever or general discomfort.  Also recommended use of honey to help with cough.  Patient verbalized understanding.

## 2017-03-12 HISTORY — DX: Gilbert syndrome: E80.4

## 2017-03-20 DIAGNOSIS — F3289 Other specified depressive episodes: Secondary | ICD-10-CM | POA: Diagnosis not present

## 2017-03-20 DIAGNOSIS — E559 Vitamin D deficiency, unspecified: Secondary | ICD-10-CM | POA: Diagnosis not present

## 2017-03-20 DIAGNOSIS — Z1389 Encounter for screening for other disorder: Secondary | ICD-10-CM | POA: Diagnosis not present

## 2017-03-20 DIAGNOSIS — G4709 Other insomnia: Secondary | ICD-10-CM | POA: Diagnosis not present

## 2017-03-30 ENCOUNTER — Other Ambulatory Visit: Payer: Self-pay | Admitting: Nurse Practitioner

## 2017-04-17 DIAGNOSIS — Z6839 Body mass index (BMI) 39.0-39.9, adult: Secondary | ICD-10-CM | POA: Diagnosis not present

## 2017-04-17 DIAGNOSIS — E668 Other obesity: Secondary | ICD-10-CM | POA: Diagnosis not present

## 2017-04-17 DIAGNOSIS — F3289 Other specified depressive episodes: Secondary | ICD-10-CM | POA: Diagnosis not present

## 2017-04-17 DIAGNOSIS — G4709 Other insomnia: Secondary | ICD-10-CM | POA: Diagnosis not present

## 2017-04-18 DIAGNOSIS — E786 Lipoprotein deficiency: Secondary | ICD-10-CM | POA: Diagnosis not present

## 2017-05-06 DIAGNOSIS — G4709 Other insomnia: Secondary | ICD-10-CM | POA: Diagnosis not present

## 2017-05-06 DIAGNOSIS — Z23 Encounter for immunization: Secondary | ICD-10-CM | POA: Diagnosis not present

## 2017-05-06 DIAGNOSIS — Z0000 Encounter for general adult medical examination without abnormal findings: Secondary | ICD-10-CM | POA: Diagnosis not present

## 2017-05-06 DIAGNOSIS — E559 Vitamin D deficiency, unspecified: Secondary | ICD-10-CM | POA: Diagnosis not present

## 2017-05-06 DIAGNOSIS — E786 Lipoprotein deficiency: Secondary | ICD-10-CM | POA: Diagnosis not present

## 2017-05-06 DIAGNOSIS — F3289 Other specified depressive episodes: Secondary | ICD-10-CM | POA: Diagnosis not present

## 2017-05-06 DIAGNOSIS — Z1389 Encounter for screening for other disorder: Secondary | ICD-10-CM | POA: Diagnosis not present

## 2018-06-06 DIAGNOSIS — E786 Lipoprotein deficiency: Secondary | ICD-10-CM | POA: Diagnosis not present

## 2018-06-06 DIAGNOSIS — E559 Vitamin D deficiency, unspecified: Secondary | ICD-10-CM | POA: Diagnosis not present

## 2018-06-13 DIAGNOSIS — E786 Lipoprotein deficiency: Secondary | ICD-10-CM | POA: Diagnosis not present

## 2018-06-13 DIAGNOSIS — R945 Abnormal results of liver function studies: Secondary | ICD-10-CM | POA: Diagnosis not present

## 2018-06-13 DIAGNOSIS — Z1331 Encounter for screening for depression: Secondary | ICD-10-CM | POA: Diagnosis not present

## 2018-06-13 DIAGNOSIS — Z0000 Encounter for general adult medical examination without abnormal findings: Secondary | ICD-10-CM | POA: Diagnosis not present

## 2018-06-13 DIAGNOSIS — G4700 Insomnia, unspecified: Secondary | ICD-10-CM | POA: Diagnosis not present

## 2018-06-13 DIAGNOSIS — F329 Major depressive disorder, single episode, unspecified: Secondary | ICD-10-CM | POA: Diagnosis not present

## 2019-07-07 DIAGNOSIS — E559 Vitamin D deficiency, unspecified: Secondary | ICD-10-CM | POA: Diagnosis not present

## 2019-07-07 DIAGNOSIS — E786 Lipoprotein deficiency: Secondary | ICD-10-CM | POA: Diagnosis not present

## 2019-07-07 DIAGNOSIS — Z0000 Encounter for general adult medical examination without abnormal findings: Secondary | ICD-10-CM | POA: Diagnosis not present

## 2019-07-09 DIAGNOSIS — E559 Vitamin D deficiency, unspecified: Secondary | ICD-10-CM | POA: Diagnosis not present

## 2019-07-09 DIAGNOSIS — E669 Obesity, unspecified: Secondary | ICD-10-CM | POA: Diagnosis not present

## 2019-07-09 DIAGNOSIS — N529 Male erectile dysfunction, unspecified: Secondary | ICD-10-CM | POA: Diagnosis not present

## 2019-07-09 DIAGNOSIS — Z0000 Encounter for general adult medical examination without abnormal findings: Secondary | ICD-10-CM | POA: Diagnosis not present

## 2019-07-09 DIAGNOSIS — Z1331 Encounter for screening for depression: Secondary | ICD-10-CM | POA: Diagnosis not present

## 2019-07-09 DIAGNOSIS — R945 Abnormal results of liver function studies: Secondary | ICD-10-CM | POA: Diagnosis not present

## 2019-07-15 ENCOUNTER — Ambulatory Visit: Payer: BC Managed Care – PPO | Attending: Internal Medicine

## 2019-07-15 DIAGNOSIS — Z20822 Contact with and (suspected) exposure to covid-19: Secondary | ICD-10-CM

## 2019-07-16 LAB — SARS-COV-2, NAA 2 DAY TAT

## 2019-07-16 LAB — NOVEL CORONAVIRUS, NAA: SARS-CoV-2, NAA: DETECTED — AB

## 2019-07-17 ENCOUNTER — Ambulatory Visit (HOSPITAL_COMMUNITY)
Admission: RE | Admit: 2019-07-17 | Discharge: 2019-07-17 | Disposition: A | Discharge status: Home / Self Care | Payer: BC Managed Care – PPO | Source: Ambulatory Visit | Attending: Pulmonary Disease | Admitting: Pulmonary Disease

## 2019-07-17 ENCOUNTER — Other Ambulatory Visit: Payer: Self-pay | Admitting: Adult Health

## 2019-07-17 ENCOUNTER — Telehealth: Payer: Self-pay | Admitting: Nurse Practitioner

## 2019-07-17 DIAGNOSIS — U071 COVID-19: Secondary | ICD-10-CM | POA: Insufficient documentation

## 2019-07-17 MED ORDER — SODIUM CHLORIDE 0.9 % IV SOLN
Freq: Once | INTRAVENOUS | Status: AC
  Filled 2019-07-17: qty 20

## 2019-07-17 MED ORDER — METHYLPREDNISOLONE SODIUM SUCC 125 MG IJ SOLR: 125.0000 mg | Freq: Once | INTRAMUSCULAR | Status: DC | PRN

## 2019-07-17 MED ORDER — DIPHENHYDRAMINE HCL 50 MG/ML IJ SOLN: 50.0000 mg | Freq: Once | INTRAMUSCULAR | Status: DC | PRN

## 2019-07-17 MED ORDER — EPINEPHRINE 0.3 MG/0.3ML IJ SOAJ: 0.3000 mg | Freq: Once | INTRAMUSCULAR | Status: DC | PRN

## 2019-07-17 MED ORDER — ALBUTEROL SULFATE HFA 108 (90 BASE) MCG/ACT IN AERS: 2.0000 | INHALATION_SPRAY | Freq: Once | RESPIRATORY_TRACT | Status: DC | PRN

## 2019-07-17 MED ORDER — SODIUM CHLORIDE 0.9 % IV SOLN: INTRAVENOUS | Status: DC | PRN

## 2019-07-17 MED ORDER — FAMOTIDINE IN NACL 20-0.9 MG/50ML-% IV SOLN: 20.0000 mg | Freq: Once | INTRAVENOUS | Status: DC | PRN

## 2019-07-17 NOTE — Progress Notes (Signed)
  I connected by phone with Evalee Mutton on 07/17/2019 at 9:40 AM to discuss the potential use of an new treatment for mild to moderate COVID-19 viral infection in non-hospitalized patients.  This patient is a 39 y.o. male that meets the FDA criteria for Emergency Use Authorization of bamlanivimab/etesevimab or casirivimab/imdevimab.  Has a (+) direct SARS-CoV-2 viral test result  Has mild or moderate COVID-19   Is ? 39 years of age and weighs ? 40 kg  Is NOT hospitalized due to COVID-19  Is NOT requiring oxygen therapy or requiring an increase in baseline oxygen flow rate due to COVID-19  Is within 10 days of symptom onset  Has at least one of the high risk factor(s) for progression to severe COVID-19 and/or hospitalization as defined in EUA.  Specific high risk criteria : BMI >/= 35 (BMI 37 ) Sx x 3 days   I have spoken and communicated the following to the patient or parent/caregiver:  1. FDA has authorized the emergency use of bamlanivimab/etesevimab and casirivimab\imdevimab for the treatment of mild to moderate COVID-19 in adults and pediatric patients with positive results of direct SARS-CoV-2 viral testing who are 30 years of age and older weighing at least 40 kg, and who are at high risk for progressing to severe COVID-19 and/or hospitalization.  2. The significant known and potential risks and benefits of bamlanivimab/etesevimab and casirivimab\imdevimab, and the extent to which such potential risks and benefits are unknown.  3. Information on available alternative treatments and the risks and benefits of those alternatives, including clinical trials.  4. Patients treated with bamlanivimab/etesevimab and casirivimab\imdevimab should continue to self-isolate and use infection control measures (e.g., wear mask, isolate, social distance, avoid sharing personal items, clean and disinfect "high touch" surfaces, and frequent handwashing) according to CDC guidelines.   5. The  patient or parent/caregiver has the option to accept or refuse bamlanivimab/etesevimab or casirivimab\imdevimab .  After reviewing this information with the patient, The patient agreed to proceed with receiving the bamlanimivab infusion and will be provided a copy of the Fact sheet prior to receiving the infusion.  Scheduled 07/17/19 at 1230  . Glennie Rodda 07/17/2019 9:40 AM

## 2019-07-17 NOTE — Progress Notes (Signed)
Patient ID: Thomas Larsen, male   DOB: Oct 19, 1980, 39 y.o.   MRN: [id number]  Diagnosis: COVID-19  Physician:wright  Procedure: [facility] Med: bamlanivimab\etesevimab infusion - Provided patient with bamlanimivab\etesevimab fact sheet for patients, parents and caregivers prior to infusion.  Complications: No immediate complications noted.  Discharge: Discharged home   Shaune Spittle 07/17/2019

## 2019-07-17 NOTE — Telephone Encounter (Signed)
erroneous

## 2019-07-17 NOTE — Discharge Instructions (Signed)

## 2019-07-22 ENCOUNTER — Encounter (HOSPITAL_COMMUNITY): Payer: Self-pay | Admitting: General Practice

## 2020-10-28 DIAGNOSIS — R7301 Impaired fasting glucose: Secondary | ICD-10-CM | POA: Diagnosis not present

## 2020-10-28 DIAGNOSIS — E786 Lipoprotein deficiency: Secondary | ICD-10-CM | POA: Diagnosis not present

## 2020-10-28 DIAGNOSIS — Z125 Encounter for screening for malignant neoplasm of prostate: Secondary | ICD-10-CM | POA: Diagnosis not present

## 2020-11-01 DIAGNOSIS — E559 Vitamin D deficiency, unspecified: Secondary | ICD-10-CM | POA: Diagnosis not present

## 2020-11-01 DIAGNOSIS — Z1212 Encounter for screening for malignant neoplasm of rectum: Secondary | ICD-10-CM | POA: Diagnosis not present

## 2020-11-01 DIAGNOSIS — R82998 Other abnormal findings in urine: Secondary | ICD-10-CM | POA: Diagnosis not present

## 2020-11-01 DIAGNOSIS — Z1331 Encounter for screening for depression: Secondary | ICD-10-CM | POA: Diagnosis not present

## 2020-11-01 DIAGNOSIS — Z0000 Encounter for general adult medical examination without abnormal findings: Secondary | ICD-10-CM | POA: Diagnosis not present

## 2020-11-01 DIAGNOSIS — Z23 Encounter for immunization: Secondary | ICD-10-CM | POA: Diagnosis not present

## 2020-11-01 DIAGNOSIS — Z1339 Encounter for screening examination for other mental health and behavioral disorders: Secondary | ICD-10-CM | POA: Diagnosis not present

## 2020-11-07 ENCOUNTER — Encounter: Payer: Self-pay | Admitting: *Deleted

## 2020-11-08 ENCOUNTER — Encounter: Payer: Self-pay | Admitting: Neurology

## 2020-11-08 ENCOUNTER — Ambulatory Visit: Payer: BC Managed Care – PPO | Admitting: Neurology

## 2020-11-08 VITALS — BP 119/80 | HR 95 | Ht 75.0 in | Wt 299.4 lb

## 2020-11-08 DIAGNOSIS — R519 Headache, unspecified: Secondary | ICD-10-CM | POA: Diagnosis not present

## 2020-11-08 DIAGNOSIS — R0683 Snoring: Secondary | ICD-10-CM | POA: Diagnosis not present

## 2020-11-08 DIAGNOSIS — G4719 Other hypersomnia: Secondary | ICD-10-CM | POA: Diagnosis not present

## 2020-11-08 DIAGNOSIS — E669 Obesity, unspecified: Secondary | ICD-10-CM

## 2020-11-08 DIAGNOSIS — R0681 Apnea, not elsewhere classified: Secondary | ICD-10-CM

## 2020-11-08 DIAGNOSIS — R351 Nocturia: Secondary | ICD-10-CM

## 2020-11-08 DIAGNOSIS — G4700 Insomnia, unspecified: Secondary | ICD-10-CM

## 2020-11-08 NOTE — Patient Instructions (Signed)

## 2020-11-08 NOTE — Progress Notes (Signed)
Subjective:    Patient ID: Thomas Larsen is a 40 y.o. male.  HPI    [name], MD, PhD [facility] [address] [address] [place], [place] [address]  Dear Dr. Waynard Edwards,  I saw your patient, Thomas Larsen, upon your kind request in the sleep clinic today for initial consultation of his sleep disorder, in particular, concern for underlying obstructive sleep apnea.  The patient is unaccompanied today.  As you know, Thomas Larsen is a 40 year old right-handed gentleman with an underlying medical history of Vitamin D deficiency, depression, anxiety, allergies, and obesity, who reports chronic difficulty maintaining sleep for years.  He is currently on temazepam.  He snores, his wife has witnessed apneic pauses while he is asleep.  He has occasionally woken up with a headache.  He has nocturia on most nights, usually once per night.  He has had a tonsillectomy as a child.  He is suspecting that 1 or both parents may have sleep apnea.  I reviewed your office note from 11/01/20.  His Epworth sleepiness score is 11 out of 24, fatigue severity score is 42 out of 63.  He lives with his family including wife and 3 children, ages 40, 22, 63 and 3 dogs.  The dog sleep in the bedroom, one of them on the bed with them.  He does have a TV on in his bedroom at night and it tends to stay on until the early morning hours.  He goes to bed generally around 9:30 PM and rise time is around 6 AM.  He works as an Airline pilot.  He quit smoking in 2005, he drinks alcohol in the form of beer, generally 1/day.  He is willing to come in for sleep study or consider a home sleep test but he is worried that he will not be able to sleep.  He is a Surveyor, minerals and tosses and turns, he is a restless sleeper.  He has occasional mouth dryness in the mornings.  His Past Medical History Is Significant For: Past Medical History:  Diagnosis Date   Depression with anxiety    Gilbert's disease 2019   ??    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   Dr Leslee Home   TONSILLECTOMY      His Family History Is Significant For: Family History  Problem Relation Age of Onset   Asthma Mother    Arthritis Mother    Diabetes type II Father    Bipolar disorder Father        borderline   Drug abuse Sister    Depression Sister    Anxiety disorder Sister     His Social History Is Significant For: Social History   Socioeconomic History   Marital status: Married    Spouse name: Not on file   Number of children: 3   Years of education: Not on file   Highest education level: Not on file  Occupational History   Not on file  Tobacco Use   Smoking status: Never   Smokeless tobacco: Never  Vaping Use   Vaping Use: Never used  Substance and Sexual Activity   Alcohol use: Yes    Alcohol/week: 5.0 standard drinks    Types: 5 Cans of beer per week   Drug use:  No   Sexual activity: Not on file  Other Topics Concern   Not on file  Social History Narrative   Not on file   Social Determinants of Health   Financial Resource Strain: Not on file  Food Insecurity: Not on file  Transportation Needs: Not on file  Physical Activity: Not on file  Stress: Not on file  Social Connections: Not on file    His Allergies Are:  Allergies  Allergen Reactions   Penicillins    Ceclor [Cefaclor] Rash  :   His Current Medications Are:  Outpatient Encounter Medications as of 11/08/2020  Medication Sig   Cholecalciferol (VITAMIN D3 PO) Take 1,000 Units by mouth daily.   HYDROcodone-acetaminophen (NORCO/VICODIN) 5-325 MG per tablet Take 1 tablet by mouth every 6 (six) hours as needed for pain.   Melatonin 10 MG TABS Take by mouth.   ondansetron (ZOFRAN) 4 MG tablet Take 1 tablet (4 mg total) by mouth every 8 (eight) hours as  needed for nausea.   tadalafil (CIALIS) 20 MG tablet Take 20 mg by mouth. Take one 30 minutes to 36 hours prior to sexual activity   Triamcinolone Acetonide (NASACORT AQ NA) Place into the nose.   fluticasone (FLONASE) 50 MCG/ACT nasal spray Place 2 sprays into both nostrils daily for 10 days.   No facility-administered encounter medications on file as of 11/08/2020.  :   Review of Systems:  Out of a complete 14 point review of systems, all are reviewed and negative with the exception of these symptoms as listed below:  Review of Systems  Neurological:        Pt is here for a sleep consultant. Pt has never had a sleep study done before. Pt states in has headaches and fatigue in the morning when he wakes and wife has witnessed him snoring and pt states he does stop breathing some during the night   FSS:42 ESS:11    Objective:  Neurological Exam  Physical Exam Physical Examination:   Vitals:   11/08/20 1445  BP: 119/80  Pulse: 95    General Examination: The patient is a very pleasant 40 y.o. male in no acute distress. He appears well-developed and well-nourished and well groomed.   HEENT: Normocephalic, atraumatic, pupils are equal, round and reactive to light, extraocular tracking is good without limitation to gaze excursion or nystagmus noted. Hearing is grossly intact. Face is symmetric with normal facial animation. Speech is clear with no dysarthria noted. There is no hypophonia. There is no lip, neck/head, jaw or voice tremor. Neck is supple with full range of passive and active motion. There are no carotid bruits on auscultation. Oropharynx exam reveals: mild mouth dryness, good dental hygiene and mild airway crowding, due to redundant soft palate, small airway entry.  Mallampati class II, tonsils absent, neck circumference of 18-1/4 inches.  He has a mild to moderate overbite.  Tongue protrudes centrally and palate elevates symmetrically.  Nasal inspection reveals a narrow nasal  passage, slight deviation of the nasal septum to the left.  Chest: Clear to auscultation without wheezing, rhonchi or crackles noted.  Heart: S1+S2+0, regular and normal without murmurs, rubs or gallops noted.   Abdomen: Soft, non-tender and non-distended with normal bowel sounds appreciated on auscultation.  Extremities: There is no pitting edema in the distal lower extremities bilaterally.   Skin: Warm and dry without trophic changes noted.   Musculoskeletal: exam reveals no obvious joint deformities, tenderness or joint swelling or erythema.   Neurologically:  Mental status: The patient is awake, alert and oriented in all 4 spheres. His immediate and remote memory, attention, language skills and fund of knowledge are appropriate. There is no evidence of aphasia, agnosia, apraxia or anomia. Speech is clear with normal prosody and enunciation. Thought process is linear. Mood is normal and affect is normal.  Cranial nerves II - XII are as described above under HEENT exam.  Motor exam: Normal bulk, strength and tone is noted. There is no tremor, Romberg is negative. Fine motor skills and coordination: grossly intact.  Cerebellar testing: No dysmetria or intention tremor. There is no truncal or gait ataxia.  Sensory exam: intact to light touch in the upper and lower extremities.  Gait, station and balance: He stands easily. No veering to one side is noted. No leaning to one side is noted. Posture is age-appropriate and stance is narrow based. Gait shows normal stride length and normal pace. No problems turning are noted. Tandem walk is unremarkable.                Assessment and Plan:   In summary, Thomas Larsen is a very pleasant 40 y.o.-year old male with an underlying medical history of Vitamin D deficiency, depression, anxiety, allergies, and obesity, whose history and physical exam are concerning for obstructive sleep apnea (OSA). I had a long chat with the patient about my findings and  the diagnosis of OSA, its prognosis and treatment options. We talked about medical treatments, surgical interventions and non-pharmacological approaches. I explained in particular the risks and ramifications of untreated moderate to severe OSA, especially with respect to developing cardiovascular disease down the Road, including congestive heart failure, difficult to treat hypertension, cardiac arrhythmias, or stroke. Even type 2 diabetes has, in part, been linked to untreated OSA. Symptoms of untreated OSA include daytime sleepiness, memory problems, mood irritability and mood disorder such as depression and anxiety, lack of energy, as well as recurrent headaches, especially morning headaches. We talked about trying to maintain a healthy lifestyle in general, as well as the importance of weight control. We also talked about the importance of good sleep hygiene. I recommended the following at this time: sleep study.  I outlined the difference between a laboratory attended sleep study versus home sleep test.  I also talked to her about possible surgical and non-surgical treatment options of OSA, including the use of a custom-made dental device (which would require a referral to a specialist dentist or oral surgeon), upper airway surgical options, such as traditional UPPP or a novel less invasive surgical option in the form of Inspire hypoglossal nerve stimulation (which would involve a referral to an ENT surgeon). I also explained the CPAP treatment option to the patient, who indicated that he would be willing to try CPAP if the need arises.  We will pick up our discussion after testing and keep him posted as to his results by phone call or email through MyChart and we will plan a follow-up accordingly.  I answered all his questions today and he was in agreement with the plan.   Thank you very much for allowing me to participate in the care of this nice patient. If I can be of any further assistance to you please  do not hesitate to call me at [phone].  Sincerely,   [name], MD, PhD

## 2020-12-07 ENCOUNTER — Ambulatory Visit (INDEPENDENT_AMBULATORY_CARE_PROVIDER_SITE_OTHER): Payer: BC Managed Care – PPO | Admitting: Neurology

## 2020-12-07 DIAGNOSIS — E669 Obesity, unspecified: Secondary | ICD-10-CM

## 2020-12-07 DIAGNOSIS — R519 Headache, unspecified: Secondary | ICD-10-CM

## 2020-12-07 DIAGNOSIS — R0681 Apnea, not elsewhere classified: Secondary | ICD-10-CM

## 2020-12-07 DIAGNOSIS — R351 Nocturia: Secondary | ICD-10-CM

## 2020-12-07 DIAGNOSIS — R0683 Snoring: Secondary | ICD-10-CM

## 2020-12-07 DIAGNOSIS — G4733 Obstructive sleep apnea (adult) (pediatric): Secondary | ICD-10-CM

## 2020-12-07 DIAGNOSIS — G4700 Insomnia, unspecified: Secondary | ICD-10-CM

## 2020-12-07 DIAGNOSIS — G4719 Other hypersomnia: Secondary | ICD-10-CM

## 2020-12-19 NOTE — Progress Notes (Signed)
See procedure note.

## 2020-12-20 NOTE — Procedures (Signed)
   [facility]  HOME SLEEP TEST (Watch PAT) REPORT  STUDY DATE: 12/07/2020  DOB: 05/08/1980  MRN: [id number]  ORDERING CLINICIAN: [name], MD, PhD   REFERRING CLINICIAN: Rodrigo Ran, MD   CLINICAL INFORMATION/HISTORY: 40 year old right-handed gentleman with an underlying medical history of Vitamin D deficiency, depression, anxiety, allergies, and obesity, who reports chronic difficulty maintaining sleep for years.  He is currently on temazepam.  He snores, his wife has witnessed apneic pauses while he is asleep.   Epworth sleepiness score: 11/24.  BMI: 37.7 kg/m  FINDINGS:   Sleep Summary:   Total Recording Time (hours, min): 7 hours, 50 minutes  Total Sleep Time (hours, min):  7 hours, 10 minutes   Percent REM (%):    22.2%   Respiratory Indices:   Calculated pAHI (per hour):  17.7/hour         REM pAHI:    34.1/hour       NREM pAHI: 13.2/hour  Oxygen Saturation Statistics:    Oxygen Saturation (%) Mean: 94%   Minimum oxygen saturation (%):                 87%   O2 Saturation Range (%): 87-98%    O2 Saturation (minutes) <=88%: 0.4 min  Pulse Rate Statistics:   Pulse Mean (bpm):    57/min    Pulse Range (46-93/min)   IMPRESSION: OSA (obstructive sleep apnea)   RECOMMENDATION:  This home sleep test demonstrates moderate obstructive sleep apnea with a total AHI of 17.7/hour and O2 nadir of 87%.  Intermittent mild to moderate snoring was detected.  Treatment with positive airway pressure is recommended. The patient will be advised to proceed with an autoPAP titration/trial at home for now. A full night titration study may be considered to optimize treatment settings, if needed down the road. Please note that untreated obstructive sleep apnea may carry additional perioperative morbidity. Patients with significant obstructive sleep apnea should receive perioperative PAP therapy and the surgeons and particularly the anesthesiologist should be  informed of the diagnosis and the severity of the sleep disordered breathing. The patient should be cautioned not to drive, work at heights, or operate dangerous or heavy equipment when tired or sleepy. Review and reiteration of good sleep hygiene measures should be pursued with any patient. Other causes of the patient's symptoms, including circadian rhythm disturbances, an underlying mood disorder, medication effect and/or an underlying medical problem cannot be ruled out based on this test. Clinical correlation is recommended. The patient and his referring provider will be notified of the test results. The patient will be seen in follow up in sleep clinic at [facility].  I certify that I have reviewed the raw data recording prior to the issuance of this report in accordance with the standards of the American Academy of Sleep Medicine (AASM).  INTERPRETING PHYSICIAN:   [name], MD, PhD  Board Certified in Neurology and Sleep Medicine  [facility] [address], [address] [place], [place] [address] [phone]

## 2020-12-20 NOTE — Addendum Note (Signed)
Addended by: [name] on: 12/20/2020 05:49 PM   Modules accepted: Orders

## 2020-12-21 ENCOUNTER — Encounter: Payer: Self-pay | Admitting: Neurology

## 2020-12-22 ENCOUNTER — Telehealth: Payer: Self-pay | Admitting: *Deleted

## 2020-12-22 ENCOUNTER — Encounter: Payer: Self-pay | Admitting: *Deleted

## 2020-12-22 NOTE — Telephone Encounter (Signed)
-----   Message from [name], MD sent at 12/20/2020  5:49 PM EDT ----- Patient referred by Dr. Waynard Edwards, seen by me on 11/08/2020, patient had a HST on 12/07/2020.    Please call and notify the patient that the recent home sleep test showed obstructive sleep apnea in the moderate range. I recommend treatment in the form of autoPAP, which means, that we don't have to bring him in for a sleep study with CPAP, but will let him start using a so called autoPAP machine at home, which is a CPAP-like machine with self-adjusting pressures. We will send the order to a local DME company (of his choice, or as per insurance requirement). The DME representative will fit him with a mask, educate him on how to use the machine, how to put the mask on, etc. I have placed an order in the chart. Please send the order, talk to patient, send report to referring MD. We will need a FU in sleep clinic for 10 weeks post-PAP set up, please arrange that with me or one of our NPs. Also reinforce the need for compliance with treatment. Thanks,   [name], MD, PhD [facility])

## 2020-12-22 NOTE — Telephone Encounter (Signed)
Owens, Ashley  Adaliah Hiegel S, RN got it!  From [facility].  Thanks!        

## 2020-12-22 NOTE — Telephone Encounter (Signed)
I called pt. I advised pt that Dr. Frances Furbish reviewed their sleep study results and found that pt had OSA. Dr. Frances Furbish recommends that pt start autopap. I reviewed PAP compliance expectations with the pt. Pt is agreeable to starting an auto-PAP. I advised pt that an order will be sent to a DME, [facility], and they will call the pt within about one week after they file with the pt's insurance. [facility] will show the pt how to use the machine, fit for masks, and troubleshoot the auto-PAP if needed. A follow up appt will be made for insurance purposes with Dr. Frances Furbish or NP when he received machine. Pt verbalized understanding to arrive 15 minutes early and bring their auto-PAP. A letter with all of this information in it will be mailed to the pt as a reminder. I verified with the pt that the address we have on file is correct. Pt verbalized understanding of results. Pt had no questions at this time but was encouraged to call back if questions arise. I have sent the order to [facility] and have received confirmation that they have received the order.

## 2021-02-20 DIAGNOSIS — G4733 Obstructive sleep apnea (adult) (pediatric): Secondary | ICD-10-CM | POA: Diagnosis not present

## 2021-02-21 ENCOUNTER — Telehealth: Payer: Self-pay | Admitting: Neurology

## 2021-02-21 NOTE — Telephone Encounter (Signed)
Pt called, received AutoCPAP 02/20/21. Schedule pt Initial CPAP on 05/17/21

## 2021-03-23 DIAGNOSIS — G4733 Obstructive sleep apnea (adult) (pediatric): Secondary | ICD-10-CM | POA: Diagnosis not present

## 2021-04-23 DIAGNOSIS — G4733 Obstructive sleep apnea (adult) (pediatric): Secondary | ICD-10-CM | POA: Diagnosis not present

## 2021-05-17 ENCOUNTER — Encounter: Payer: Self-pay | Admitting: Adult Health

## 2021-05-17 ENCOUNTER — Ambulatory Visit: Payer: BC Managed Care – PPO | Admitting: Adult Health

## 2021-05-17 VITALS — BP 141/88 | HR 74 | Ht 74.0 in | Wt 279.0 lb

## 2021-05-17 DIAGNOSIS — G4733 Obstructive sleep apnea (adult) (pediatric): Secondary | ICD-10-CM

## 2021-05-17 DIAGNOSIS — Z9989 Dependence on other enabling machines and devices: Secondary | ICD-10-CM | POA: Diagnosis not present

## 2021-05-17 NOTE — Progress Notes (Signed)
[facility] [address] [place]. [place] [id number] [phone]       OFFICE FOLLOW UP NOTE  Thomas Larsen Date of Birth:  12-26-1980 Medical Record Number:  [id number]   Reason for visit: Initial CPAP follow-up    SUBJECTIVE:   CHIEF COMPLAINT:  Chief Complaint  Patient presents with   Obstructive Sleep Apnea    RM 3 alone Pt is well and stable, just having trouble getting use to CPAP    HPI:   Update 05/17/2021 JM: 41 year old male who is being seen for initial CPAP compliance visit.  Completed HST 12/07/2020 which showed moderate OSA with total AHI of 17.7/h.  Recommended initiation of AutoPap which was started on 02/20/2021.   Compliance report as below showing total usage at 96.7% and greater than 4 hours usage 76.7%.  Residual AHI 0.6.   Does report some difficulty tolerating mask with use throughout the entire night.  Will experience leaks when laying on his side and will wake him up. Currently using full face. Tried nasal pillows but unable to use as he is a mouth breather - did try chin strap but more difficulty tolerating chin strap than full face mask. Fatigue has slightly improved, does feel more rested in the morning upon awakening, still needing to take a daytime nap.   Epworth Sleepiness Scale 13/24 (prior 11/24) Fatigue severity scale 33/63 (prior 42/63)                History provided for reference purposes only Consult visit 11/08/2020 Dr. Frances FurbishAthar: Thomas Larsen is a 41 year old right-handed gentleman with an underlying medical history of Vitamin D deficiency, depression, anxiety, allergies, and obesity, who reports chronic difficulty maintaining sleep for years.  He is currently on temazepam.  He snores, his wife has witnessed apneic pauses while he is asleep.  He has occasionally woken up with a headache.  He has nocturia on most nights, usually once per night.  He has had a tonsillectomy as a child.  He is suspecting that 1  or both parents may have sleep apnea.  I reviewed your office note from 11/01/20.  His Epworth sleepiness score is 11 out of 24, fatigue severity score is 42 out of 63.  He lives with his family including wife and 3 children, ages 2115, 413, 710 and 3 dogs.  The dog sleep in the bedroom, one of them on the bed with them.  He does have a TV on in his bedroom at night and it tends to stay on until the early morning hours.  He goes to bed generally around 9:30 PM and rise time is around 6 AM.  He works as an Airline pilotaccountant.  He quit smoking in 2005, he drinks alcohol in the form of beer, generally 1/day.  He is willing to come in for sleep study or consider a home sleep test but he is worried that he will not be able to sleep.  He is a Surveyor, mineralsstomach sleeper and tosses and turns, he is a restless sleeper.  He has occasional mouth dryness in the mornings.           ROS:   14 system review of systems performed and negative with exception of those listed in HPI  PMH:  Past Medical History:  Diagnosis Date   Depression with anxiety    Gilbert's disease 2019   ??   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   Dr Leslee Home   TONSILLECTOMY      Social History:  Social History   Socioeconomic History   Marital status: Married    Spouse name: Not on file   Number of children: 3   Years of education: Not on file   Highest education level: Not on file  Occupational History   Not on file  Tobacco Use   Smoking status: Never   Smokeless tobacco: Never  Vaping Use   Vaping Use: Never used  Substance and Sexual Activity   Alcohol use: Yes    Alcohol/week: 5.0 standard drinks    Types: 5 Cans of beer per week   Drug use: No   Sexual activity: Not on file  Other Topics Concern   Not on file  Social History Narrative   Not on file   Social Determinants of Health    Financial Resource Strain: Not on file  Food Insecurity: Not on file  Transportation Needs: Not on file  Physical Activity: Not on file  Stress: Not on file  Social Connections: Not on file  Intimate Partner Violence: Not on file    Family History:  Family History  Problem Relation Age of Onset   Asthma Mother    Arthritis Mother    Diabetes type II Father    Bipolar disorder Father        borderline   Drug abuse Sister    Depression Sister    Anxiety disorder Sister     Medications:   Current Outpatient Medications on File Prior to Visit  Medication Sig Dispense Refill   Cholecalciferol (VITAMIN D3 PO) Take 1,000 Units by mouth daily.     Fexofenadine HCl (ALLEGRA PO) Take by mouth as needed.     Melatonin 10 MG TABS Take 5 mg by mouth.     sertraline (ZOLOFT) 100 MG tablet Take 200 mg by mouth daily.     tadalafil (CIALIS) 20 MG tablet Take 20 mg by mouth. Take one 30 minutes to 36 hours prior to sexual activity     temazepam (RESTORIL) 15 MG capsule Take 15 mg by mouth at bedtime. 2 capsules nightly     Triamcinolone Acetonide (NASACORT AQ NA) Place into the nose.     No current facility-administered medications on file prior to visit.    Allergies:   Allergies  Allergen Reactions   Penicillins    Ceclor [Cefaclor] Rash      OBJECTIVE:  Physical Exam  Vitals:   05/17/21 1531  BP: (!) 141/88  Pulse: 74  Weight: 279 lb (126.6 kg)  Height: 6\' 2"  (1.88 m)   Body mass index is 35.82 kg/m. No results found.  General: well developed, well nourished, pleasant middle-age Caucasian male, seated, in no evident distress Head: head normocephalic and atraumatic.   Neck: supple with no carotid or supraclavicular bruits; neck circumference 17" Cardiovascular: regular rate and rhythm, no murmurs Musculoskeletal: no deformity Skin:  no rash/petichiae Vascular:  Normal pulses all extremities   Neurologic Exam Mental Status: Awake and fully alert. Oriented to  place and time. Recent and remote memory intact. Attention span, concentration and fund of knowledge appropriate. Mood and affect appropriate.  Cranial Nerves: Pupils equal, briskly reactive to light. Extraocular movements full without nystagmus. Visual fields full to confrontation. Hearing intact. Facial sensation intact. Face, tongue, palate moves normally and symmetrically.  Motor: Normal bulk and tone.  Normal strength in all tested extremity muscles Sensory.: intact to touch , pinprick , position and vibratory sensation.  Coordination: Rapid alternating movements normal in all extremities. Finger-to-nose and heel-to-shin performed accurately bilaterally. Gait and Station: Arises from chair without difficulty. Stance is normal. Gait demonstrates normal stride length and balance without use of AD. Tandem walk and heel toe without difficulty.  Reflexes: 1+ and symmetric. Toes downgoing.         ASSESSMENT/PLAN: Thomas Larsen is a 41 y.o. year old male with moderate OSA per HST completed in 11/2020. Returns today for initial AutoPap compliance visit     OSA on CPAP : Excellent overall compliance but did discuss ensuring nightly use >4 hour duration which should hopefully improve as tolerance improves.  Discussed ways to help improve tolerance.  Optimal residual AHI at 0.6.  Continue current AutoPap settings.  Continue to follow with DME company for any needed supplies or CPAP related concerns.     Orders Placed This Encounter  Procedures   For home use only DME continuous positive airway pressure (CPAP)    Continued supplies as indicated    Order Specific Question:   Length of Need    Answer:   Lifetime    Order Specific Question:   Patient has OSA or probable OSA    Answer:   Yes    Order Specific Question:   Is the patient currently using CPAP in the home    Answer:   Yes    Order Specific Question:   If yes (to question two)    Answer:   Determine DME provider and inform them of  any new orders/settings    Order Specific Question:   Settings    Answer:   Autotitration    Order Specific Question:   CPAP supplies needed    Answer:   Mask, headgear, cushions, filters, heated tubing and water chamber       Follow up in 6 months or call earlier if needed   CC:  PCP: Rodrigo Ran, MD    I spent 31 minutes of face-to-face and non-face-to-face time with patient.  This included previsit chart review, lab review, study review, order entry, electronic health record documentation, patient education regarding diagnosis of sleep apnea with review and discussion of compliance report and answered all other questions to patient's satisfaction   Ihor Austin, [facility]  [facility] [address] [place], [place] [address]  Phone [phone] Fax [phone] Note: This document was prepared with digital dictation and possible smart phrase technology. Any transcriptional errors that result from this process are unintentional.

## 2021-05-17 NOTE — Patient Instructions (Signed)
       Thank you for coming to see us at [facility]. I hope we have been able to provide you high quality care today.  You may receive a patient satisfaction survey over the next few weeks. We would appreciate your feedback and comments so that we may continue to improve ourselves and the health of our patients.  

## 2021-05-21 DIAGNOSIS — G4733 Obstructive sleep apnea (adult) (pediatric): Secondary | ICD-10-CM | POA: Diagnosis not present

## 2021-06-21 DIAGNOSIS — G4733 Obstructive sleep apnea (adult) (pediatric): Secondary | ICD-10-CM | POA: Diagnosis not present

## 2021-07-21 DIAGNOSIS — G4733 Obstructive sleep apnea (adult) (pediatric): Secondary | ICD-10-CM | POA: Diagnosis not present

## 2021-08-21 DIAGNOSIS — G4733 Obstructive sleep apnea (adult) (pediatric): Secondary | ICD-10-CM | POA: Diagnosis not present

## 2021-09-13 DIAGNOSIS — R3911 Hesitancy of micturition: Secondary | ICD-10-CM | POA: Diagnosis not present

## 2021-09-13 DIAGNOSIS — M545 Low back pain, unspecified: Secondary | ICD-10-CM | POA: Diagnosis not present

## 2021-09-14 ENCOUNTER — Ambulatory Visit
Admission: RE | Admit: 2021-09-14 | Discharge: 2021-09-14 | Disposition: A | Discharge status: Home / Self Care | Payer: BC Managed Care – PPO | Source: Ambulatory Visit | Attending: Adult Health | Admitting: Adult Health

## 2021-09-14 ENCOUNTER — Other Ambulatory Visit: Payer: Self-pay | Admitting: Adult Health

## 2021-09-14 DIAGNOSIS — R109 Unspecified abdominal pain: Secondary | ICD-10-CM | POA: Diagnosis not present

## 2021-09-14 DIAGNOSIS — R319 Hematuria, unspecified: Secondary | ICD-10-CM | POA: Diagnosis not present

## 2021-09-14 DIAGNOSIS — N200 Calculus of kidney: Secondary | ICD-10-CM | POA: Diagnosis not present

## 2021-09-14 DIAGNOSIS — R3129 Other microscopic hematuria: Secondary | ICD-10-CM

## 2021-09-20 DIAGNOSIS — G4733 Obstructive sleep apnea (adult) (pediatric): Secondary | ICD-10-CM | POA: Diagnosis not present

## 2021-10-02 DIAGNOSIS — G4733 Obstructive sleep apnea (adult) (pediatric): Secondary | ICD-10-CM | POA: Diagnosis not present

## 2021-10-21 DIAGNOSIS — G4733 Obstructive sleep apnea (adult) (pediatric): Secondary | ICD-10-CM | POA: Diagnosis not present

## 2021-11-02 DIAGNOSIS — G4733 Obstructive sleep apnea (adult) (pediatric): Secondary | ICD-10-CM | POA: Diagnosis not present

## 2021-11-14 DIAGNOSIS — E785 Hyperlipidemia, unspecified: Secondary | ICD-10-CM | POA: Diagnosis not present

## 2021-11-14 DIAGNOSIS — R7301 Impaired fasting glucose: Secondary | ICD-10-CM | POA: Diagnosis not present

## 2021-11-14 DIAGNOSIS — E786 Lipoprotein deficiency: Secondary | ICD-10-CM | POA: Diagnosis not present

## 2021-11-14 DIAGNOSIS — E559 Vitamin D deficiency, unspecified: Secondary | ICD-10-CM | POA: Diagnosis not present

## 2021-11-14 DIAGNOSIS — Z125 Encounter for screening for malignant neoplasm of prostate: Secondary | ICD-10-CM | POA: Diagnosis not present

## 2021-11-20 DIAGNOSIS — N200 Calculus of kidney: Secondary | ICD-10-CM | POA: Diagnosis not present

## 2021-11-20 DIAGNOSIS — E785 Hyperlipidemia, unspecified: Secondary | ICD-10-CM | POA: Diagnosis not present

## 2021-11-20 DIAGNOSIS — R82998 Other abnormal findings in urine: Secondary | ICD-10-CM | POA: Diagnosis not present

## 2021-11-20 DIAGNOSIS — Z0000 Encounter for general adult medical examination without abnormal findings: Secondary | ICD-10-CM | POA: Diagnosis not present

## 2021-11-22 ENCOUNTER — Ambulatory Visit: Payer: BC Managed Care – PPO | Admitting: Adult Health

## 2021-11-22 ENCOUNTER — Encounter: Payer: Self-pay | Admitting: Adult Health

## 2021-11-22 VITALS — BP 120/86 | HR 64 | Ht 75.0 in | Wt 277.2 lb

## 2021-11-22 DIAGNOSIS — Z9989 Dependence on other enabling machines and devices: Secondary | ICD-10-CM

## 2021-11-22 DIAGNOSIS — G4733 Obstructive sleep apnea (adult) (pediatric): Secondary | ICD-10-CM

## 2021-11-22 NOTE — Progress Notes (Signed)
[facility] [address] [place]. [place] [address] [phone]       OFFICE FOLLOW UP NOTE  Thomas Larsen Date of Birth:  November 01, 1980 Medical Record Number:  [id number]    Primary neurologist: Dr. Rexene Larsen Reason for visit: CPAP follow-up    SUBJECTIVE:   CHIEF COMPLAINT:  Chief Complaint  Patient presents with   Obstructive Sleep Apnea    Pt reports feeling overall okay. No questions or concerns. Room 3 alone    HPI:   Update 11/22/2021 JM: Patient returns for 22-month CPAP follow-up visit.  Review of compliance report over the past 30 days shows 29 out of 30 usage days with 29 days greater than 4 hours for 96.6% compliance.  Average usage 7 hours and 19 minutes.  Residual AHI 0.8.  Pressure in the 95th percentile 7.9 with AutoPap setting 5-12 with ramp pressure 4.  Average leak 1.6.  Feels like he is tolerating CPAP better and able to wear for longer duration at night.  He is currently using a fullface mask but has had some difficulty tolerating the tubing coming out front instead of on top of his head.  He is currently looking at different mask options.  He does mention difficulty sleeping throughout the full night, can wake around 3 to 4 AM, at times able to fall back asleep.  Currently on temazepam 30 mg nightly by PCP, he was advised to further discuss with [place] insomnia concerns.  Epworth Sleepiness Scale 9/24.  Fatigue severity scale 42/63.  No further concerns at this time.     History provided for reference purposes only Update 05/17/2021 JM: 41 year old male who is being seen for initial CPAP compliance visit.  Completed HST 12/07/2020 which showed moderate OSA with total AHI of 17.7/h.  Recommended initiation of AutoPap which was started on 02/20/2021.   Compliance report as below showing total usage at 96.7% and greater than 4 hours usage 76.7%.  Residual AHI 0.6.   Does report some difficulty tolerating mask with use throughout the entire night.   Will experience leaks when laying on his side and will wake him up. Currently using full face. Tried nasal pillows but unable to use as he is a mouth breather - did try chin strap but more difficulty tolerating chin strap than full face mask. Fatigue has slightly improved, does feel more rested in the morning upon awakening, still needing to take a daytime nap.   Epworth Sleepiness Scale 13/24 (prior 11/24) Fatigue severity scale 33/63 (prior 42/63)             History provided for reference purposes only Consult visit 11/08/2020 Dr. Rexene Larsen: Thomas Larsen is a 41 year old right-handed gentleman with an underlying medical history of Vitamin D deficiency, depression, anxiety, allergies, and obesity, who reports chronic difficulty maintaining sleep for years.  He is currently on temazepam.  He snores, his wife has witnessed apneic pauses while he is asleep.  He has occasionally woken up with a headache.  He has nocturia on most nights, usually once per night.  He has had a tonsillectomy as a child.  He is suspecting that 1 or both parents may have sleep apnea.  I reviewed your office note from 11/01/20.  His Epworth sleepiness score is 11 out of 24, fatigue severity score is 42 out of 63.  He lives with his family including wife and 3 children, ages 81, 60, 64 and 3 dogs.  The dog sleep in the bedroom, one of them  on the bed with them.  He does have a TV on in his bedroom at night and it tends to stay on until the early morning hours.  He goes to bed generally around 9:30 PM and rise time is around 6 AM.  He works as an Airline pilot.  He quit smoking in 2005, he drinks alcohol in the form of beer, generally 1/day.  He is willing to come in for sleep study or consider a Larsen sleep test but he is worried that he will not be able to sleep.  He is a Surveyor, minerals and tosses and turns, he is a restless sleeper.  He has occasional mouth dryness in the mornings.           ROS:   14 system review of  systems performed and negative with exception of those listed in HPI  PMH:  Past Medical History:  Diagnosis Date   Depression with anxiety    Gilbert's disease 2019   ??   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   Dr Thomas Larsen   TONSILLECTOMY      Social History:  Social History   Socioeconomic History   Marital status: Married    Spouse name: Not on file   Number of children: 3   Years of education: Not on file   Highest education level: Not on file  Occupational History   Not on file  Tobacco Use   Smoking status: Never   Smokeless tobacco: Never  Vaping Use   Vaping Use: Never used  Substance and Sexual Activity   Alcohol use: Yes    Alcohol/week: 5.0 standard drinks of alcohol    Types: 5 Cans of beer per week   Drug use: No   Sexual activity: Not on file  Other Topics Concern   Not on file  Social History Narrative   Not on file   Social Determinants of Health   Financial Resource Strain: Not on file  Food Insecurity: Not on file  Transportation Needs: Not on file  Physical Activity: Not on file  Stress: Not on file  Social Connections: Not on file  Intimate Partner Violence: Not on file    Family History:  Family History  Problem Relation Age of Onset   Asthma Mother    Arthritis Mother    Diabetes type II Father    Bipolar disorder Father        borderline   Drug abuse Sister    Depression Sister    Anxiety disorder Sister     Medications:   Current Outpatient Medications on File Prior to Visit  Medication Sig Dispense Refill   Cholecalciferol (VITAMIN D3 PO) Take 1,000 Units by mouth daily.     Fexofenadine HCl (ALLEGRA PO) Take by mouth as needed.     sertraline (ZOLOFT) 100 MG tablet Take 200 mg by mouth daily.     tadalafil (CIALIS) 20 MG tablet Take 20 mg by mouth. Take one 30 minutes to 36 hours  prior to sexual activity     temazepam (RESTORIL) 15 MG capsule Take 15 mg by mouth at bedtime. 2 capsules nightly     Triamcinolone Acetonide (NASACORT AQ NA) Place into the nose.     Melatonin 10 MG TABS Take 5 mg by mouth.     No current  facility-administered medications on file prior to visit.    Allergies:   Allergies  Allergen Reactions   Penicillins    Ceclor [Cefaclor] Rash      OBJECTIVE:  Physical Exam  Vitals:   11/22/21 1504  BP: 120/86  Pulse: 64  Weight: 277 lb 4 oz (125.8 kg)  Height: 6\' 3"  (1.905 m)    Body mass index is 34.65 kg/m. No results found.  General: well developed, well nourished, pleasant middle-age Caucasian male, seated, in no evident distress Head: head normocephalic and atraumatic.   Neck: supple with no carotid or supraclavicular bruits Cardiovascular: regular rate and rhythm, no murmurs Musculoskeletal: no deformity Skin:  no rash/petichiae Vascular:  Normal pulses all extremities   Neurologic Exam Mental Status: Awake and fully alert. Oriented to place and time. Recent and remote memory intact. Attention span, concentration and fund of knowledge appropriate. Mood and affect appropriate.  Cranial Nerves: Pupils equal, briskly reactive to light. Extraocular movements full without nystagmus. Visual fields full to confrontation. Hearing intact. Facial sensation intact. Face, tongue, palate moves normally and symmetrically.  Motor: Normal bulk and tone. Normal strength in all tested extremity muscles Sensory.: intact to touch , pinprick , position and vibratory sensation.  Coordination: Rapid alternating movements normal in all extremities. Finger-to-nose and heel-to-shin performed accurately bilaterally. Gait and Station: Arises from chair without difficulty. Stance is normal. Gait demonstrates normal stride length and balance without use of AD. Tandem walk and heel toe without difficulty.  Reflexes: 1+ and symmetric. Toes downgoing.          ASSESSMENT/PLAN: Thomas Larsen is a 42 y.o. year old male with moderate OSA per HST completed in 11/2020. Returns today for initial AutoPap compliance visit     OSA on CPAP : Excellent compliance and optimal residual AHI.  Continue current pressure settings.  Follow-up with DME company [employer] to discuss other mask options.  Continue to follow with DME company for any needed supplies or CPAP related concerns.  Insomnia: Currently on temazepam without much benefit. As apnea is well controlled currently, insomnia is likely stemming from a different cause other than apnea.  He was encouraged to further discuss options with PCP or establish care with psychiatry for further treatment options    Orders Placed This Encounter  Procedures   For Larsen use only DME continuous positive airway pressure (CPAP)    Order Specific Question:   Length of Need    Answer:   Lifetime    Order Specific Question:   Patient has OSA or probable OSA    Answer:   Yes    Order Specific Question:   Is the patient currently using CPAP in the Larsen    Answer:   Yes    Order Specific Question:   If yes (to question two)    Answer:   Determine DME provider and inform them of any new orders/settings    Order Specific Question:   Date of face to face encounter    Answer:   11/22/2021    Order Specific Question:   Settings    Answer:   Autotitration    Order Specific Question:   CPAP supplies needed    Answer:   Mask, headgear, cushions, filters, heated tubing and water chamber       Follow up in 1 year or call earlier if needed   CC:  PCP: Crist Infante, MD    I spent 26 minutes of face-to-face and non-face-to-face time with patient.  This included previsit chart review, lab review, study review, order entry, electronic health record documentation, patient education regarding sleep apnea with review and discussion of compliance report and insomnia concerns and answered all other questions to  patient's satisfaction   Ihor Austin, [facility]  [facility] [address] [place], [place] [address]  Phone [phone] Fax [phone] Note: This document was prepared with digital dictation and possible smart phrase technology. Any transcriptional errors that result from this process are unintentional.

## 2021-11-22 NOTE — Patient Instructions (Signed)
Continue current CPAP settings and nightly use of CPAP for continued control of sleep apnea  Please further discuss concerns of insomnia with your PCP as your apnea is well controlled and insomnia likely being caused by something else. Sometimes it is beneficial to be seen by psychiatry for further treatment options.    Follow-up in 1 year or call earlier if needed

## 2021-11-23 NOTE — Progress Notes (Signed)
Community message sent to [employer], Jari Favre, Ena Dawley re: new CPAP orders are in Epic.

## 2021-12-03 DIAGNOSIS — G4733 Obstructive sleep apnea (adult) (pediatric): Secondary | ICD-10-CM | POA: Diagnosis not present

## 2022-01-11 DIAGNOSIS — G4733 Obstructive sleep apnea (adult) (pediatric): Secondary | ICD-10-CM | POA: Diagnosis not present

## 2022-12-10 ENCOUNTER — Encounter: Payer: Self-pay | Admitting: Adult Health

## 2022-12-10 ENCOUNTER — Ambulatory Visit: Payer: 59 | Admitting: Adult Health

## 2022-12-10 VITALS — BP 124/81 | HR 77 | Ht 74.0 in | Wt 267.0 lb

## 2022-12-10 DIAGNOSIS — G4733 Obstructive sleep apnea (adult) (pediatric): Secondary | ICD-10-CM

## 2022-12-10 NOTE — Progress Notes (Signed)
[facility] [address] [place].  [address] [phone]       OFFICE FOLLOW UP NOTE  [name] Date of Birth:  24-Jan-1981 Medical Record Number:  [id number]    Primary neurologist: Dr. Frances Furbish Reason for visit: CPAP follow-up    SUBJECTIVE:   CHIEF COMPLAINT:  Chief Complaint  Patient presents with   Follow-up    Pt alone. Rm 8. Here for cpap yearly follow up. Uses Luna machine. States overall doing well. No issues or concerns. DME [facility]   Follow-up visit:  Prior visit: 11/22/2021  Brief HPI:   Thomas Larsen is a 42 y.o. male who was initially evaluated by Dr. Frances Furbish on 11/08/2020 for concern of underlying sleep apnea with complaints of snoring, witnessed apneas, chronic insomnia, occasional morning headache and daytime fatigue. ESS 11/24. FSS 42/63. Completed HST 12/07/2020 which showed moderate OSA with total AHI of 17.7/h.  Recommended initiation of AutoPap, set up date 02/20/2021. DME [facility].   At prior visit, compliance report showed satisfactory usage and optimal residual AHI. Some mask related concerns, planned to discuss other options with DME.  Complained of continued insomnia despite temazepam, advised to further discuss with PCP or establish care with psychiatry for further management. ESS 9/24. FSS 42/63.     Interval history:  Compliance report shows excellent usage and optimal residual AHI.  Current use of FFM, tolerating well.  ESS 10/24.  Remains on temazepam for insomnia managed by PCP.  Routinely follows with DME [facility] and up to date on supplies.  No questions or concerns today.              ROS:   14 system review of systems performed and negative with exception of those listed in HPI  PMH:  Past Medical History:  Diagnosis Date   Depression with anxiety    Gilbert's disease 2019   ??   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   Dr Leslee Home   TONSILLECTOMY      Social History:  Social History   Socioeconomic History   Marital status: Married    Spouse name: Not on file   Number of children: 3   Years of education: Not on file   Highest education level: Not on file  Occupational History   Not on file  Tobacco Use   Smoking status: Never   Smokeless tobacco: Never  Vaping Use   Vaping status: Never Used  Substance and Sexual Activity   Alcohol use: Yes    Alcohol/week: 5.0 standard drinks of alcohol    Types: 5 Cans of beer per week   Drug use: No   Sexual activity: Not on file  Other Topics Concern   Not on file  Social History Narrative   Not on file   Social Determinants of Health   Financial Resource Strain: Not on file  Food Insecurity: Not on file  Transportation Needs: Not on file  Physical Activity: Not on file  Stress: Not on file  Social Connections: Not on file  Intimate Partner Violence: Not on file    Family History:  Family History  Problem Relation Age of Onset   Asthma Mother    Arthritis Mother    Diabetes type II Father    Bipolar disorder Father  borderline   Drug abuse Sister    Depression Sister    Anxiety disorder Sister     Medications:   Current Outpatient Medications on File Prior to Visit  Medication Sig Dispense Refill   sertraline (ZOLOFT) 100 MG tablet Take 200 mg by mouth daily.     tadalafil (CIALIS) 20 MG tablet Take 20 mg by mouth. Take one 30 minutes to 36 hours prior to sexual activity     temazepam (RESTORIL) 15 MG capsule Take 15 mg by mouth at bedtime. 2 capsules nightly     No current facility-administered medications on file prior to visit.    Allergies:   Allergies  Allergen Reactions   Penicillins    Ceclor [Cefaclor] Rash      OBJECTIVE:  Physical Exam  Vitals:   12/10/22 1447  BP: 124/81  Pulse: 77  Weight: 267 lb (121.1  kg)  Height: 6\' 2"  (1.88 m)   Body mass index is 34.28 kg/m. No results found.  General: well developed, well nourished, pleasant middle-age Caucasian male, seated, in no evident distress Head: head normocephalic and atraumatic.   Neck: supple with no carotid or supraclavicular bruits Cardiovascular: regular rate and rhythm, no murmurs Musculoskeletal: no deformity Skin:  no rash/petichiae Vascular:  Normal pulses all extremities   Neurologic Exam Mental Status: Awake and fully alert. Oriented to place and time. Recent and remote memory intact. Attention span, concentration and fund of knowledge appropriate. Mood and affect appropriate.  Cranial Nerves: Pupils equal, briskly reactive to light. Extraocular movements full without nystagmus. Visual fields full to confrontation. Hearing intact. Facial sensation intact. Face, tongue, palate moves normally and symmetrically.  Motor: Normal bulk and tone. Normal strength in all tested extremity muscles Gait and Station: Arises from chair without difficulty. Stance is normal. Gait demonstrates normal stride length and balance without use of AD.  Reflexes: 1+ and symmetric. Toes downgoing.         ASSESSMENT/PLAN: Thomas Larsen is a 42 y.o. year old male with moderate OSA per HST completed in 11/2020.      OSA on CPAP : Excellent compliance and optimal residual AHI.  Continue current pressure settings.  Continue nightly use of CPAP with ensuring greater than 4 hours per night for optimal benefit.  Continue to follow with DME company for any needed supplies or CPAP related concerns.      Orders Placed This Encounter  Procedures   For home use only DME continuous positive airway pressure (CPAP)    Order Specific Question:   Length of Need    Answer:   Lifetime    Order Specific Question:   Patient has OSA or probable OSA    Answer:   Yes    Order Specific Question:   Is the patient currently using CPAP in the home    Answer:   Yes     Order Specific Question:   If yes (to question two)    Answer:   Determine DME provider and inform them of any new orders/settings    Order Specific Question:   Date of face to face encounter    Answer:   12/10/2022    Order Specific Question:   Settings    Answer:   Autotitration    Order Specific Question:   CPAP supplies needed    Answer:   Mask, headgear, cushions, filters, heated tubing and water chamber     Follow up in 1 year via MyChart video visit or call earlier  if needed      CC:  PCP: Rodrigo Ran, MD    I spent 15 minutes of face-to-face and non-face-to-face time with patient.  This included previsit chart review, lab review, study review, order entry, electronic health record documentation, patient education regarding sleep apnea with review and discussion of compliance report and insomnia concerns and answered all other questions to patient's satisfaction   Ihor Austin, [facility]  [facility] [address] [place], [place] [address]  Phone [phone] Fax [phone] Note: This document was prepared with digital dictation and possible smart phrase technology. Any transcriptional errors that result from this process are unintentional.

## 2023-12-03 ENCOUNTER — Other Ambulatory Visit: Payer: Self-pay

## 2023-12-03 ENCOUNTER — Other Ambulatory Visit (HOSPITAL_BASED_OUTPATIENT_CLINIC_OR_DEPARTMENT_OTHER): Payer: Self-pay

## 2023-12-03 MED ORDER — SERTRALINE HCL 100 MG PO TABS
200.0000 mg | ORAL_TABLET | Freq: Every day | ORAL | 3 refills | Status: DC
  Filled 2023-12-03: qty 120, 60d supply, fill #0

## 2023-12-03 MED ORDER — TEMAZEPAM 15 MG PO CAPS
30.0000 mg | ORAL_CAPSULE | Freq: Every evening | ORAL | 4 refills | Status: AC | PRN
  Filled 2023-12-03: qty 60, 30d supply, fill #0
  Filled 2023-12-29 – 2024-01-01 (×2): qty 60, 30d supply, fill #1
  Filled 2024-01-29: qty 60, 30d supply, fill #2
  Filled 2024-02-26: qty 60, 30d supply, fill #3

## 2023-12-09 NOTE — Progress Notes (Unsigned)
 [facility] [address] [place]. [place] [address] [phone]       OFFICE FOLLOW UP NOTE  [name] Date of Birth:  1980/07/20 Medical Record Number:  [id number]    Primary neurologist: Dr. Buck Reason for visit: CPAP follow-up   Virtual Visit via Video Note  I connected with Thomas Larsen on 12/10/23 at  8:45 AM EDT by a video enabled telemedicine application and verified that I am speaking with the correct person using two identifiers.  Location: Patient: at work, in [employer] Provider: in office, [facility]   I discussed the limitations of evaluation and management by telemedicine and the availability of in person appointments. The patient expressed understanding and agreed to proceed.   SUBJECTIVE:  Follow-up visit:  Prior visit: 12/10/2022  Brief HPI:   Thomas Larsen is a 43 y.o. male who was initially evaluated by Dr. Buck on 11/08/2020 for concern of underlying sleep apnea with complaints of snoring, witnessed apneas, chronic insomnia, occasional morning headache and daytime fatigue. ESS 11/24. FSS 42/63. Completed HST 12/07/2020 which showed moderate OSA with total AHI of 17.7/h.  Recommended initiation of AutoPap, set up date 02/20/2021. DME [facility].      Interval history:  Compliance report shows excellent usage and optimal residual AHI.  Current use of FFM, tolerating well.  He does mention more recent issues with humidity, has tried auto setting but woke with very dry mouth, changed to manual and put out setting up to but again had very dry mouth, tried setting of 3 and felt like he was water boarding himself.  ESS 9/24.  Remains on temazepam  for insomnia managed by PCP.  Routinely follows with DME [facility] and up to date on supplies.  No further questions or concerns today.              ROS:   14 system review of systems performed and negative with exception of those listed in HPI  PMH:  Past Medical History:   Diagnosis Date   Depression with anxiety    Gilbert's disease 2019   ??   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   Dr Carlos   TONSILLECTOMY      Social History:  Social History   Socioeconomic History   Marital status: Married    Spouse name: Not on file   Number of children: 3   Years of education: Not on file   Highest education level: Not on file  Occupational History   Not on file  Tobacco Use   Smoking status: Never   Smokeless tobacco: Never  Vaping Use   Vaping status: Never Used  Substance and Sexual Activity   Alcohol  use: Yes    Alcohol /week: 5.0 standard drinks of alcohol     Types: 5 Cans of beer per week   Drug use: No   Sexual activity: Not on file  Other Topics Concern   Not on file  Social History Narrative   Not on file   Social Drivers of Health   Financial Resource Strain: Not on file  Food Insecurity: Not on file  Transportation Needs: Not on file  Physical Activity: Not on file  Stress: Not on file  Social Connections: Not on file  Intimate Partner Violence: Not on file    Family  History:  Family History  Problem Relation Age of Onset   Asthma Mother    Arthritis Mother    Diabetes type II Father    Bipolar disorder Father        borderline   Drug abuse Sister    Depression Sister    Anxiety disorder Sister     Medications:   Current Outpatient Medications on File Prior to Visit  Medication Sig Dispense Refill   sertraline  (ZOLOFT ) 100 MG tablet Take 200 mg by mouth daily.     sertraline  (ZOLOFT ) 100 MG tablet Take 2 tablets (200 mg total) by mouth daily. 180 tablet 3   tadalafil (CIALIS) 20 MG tablet Take 20 mg by mouth. Take one 30 minutes to 36 hours prior to sexual activity     temazepam  (RESTORIL ) 15 MG capsule Take 15 mg by mouth at bedtime. 2 capsules nightly     temazepam   (RESTORIL ) 15 MG capsule Take 2 capsules (30 mg total) by mouth at bedtime as needed. 60 capsule 4   No current facility-administered medications on file prior to visit.    Allergies:   Allergies  Allergen Reactions   Penicillins    Ceclor [Cefaclor] Rash      OBJECTIVE:  Physical Exam  General: well developed, well nourished, pleasant middle-age Caucasian male, seated, in no evident distress  Neurologic Exam Mental Status: Awake and fully alert. Oriented to place and time. Recent and remote memory intact. Attention span, concentration and fund of knowledge appropriate. Mood and affect appropriate.         ASSESSMENT/PLAN: Thomas Larsen is a 43 y.o. year old male with moderate OSA per HST completed in 11/2020.      OSA on CPAP :  Excellent compliance and optimal residual AHI.   Continue current pressure settings of 5-12 Discussed ways to help with humidification concerns and likely due to frequent changes in weather but if concerns persist, advised to contact DME for further recommendations Continue nightly use of CPAP with ensuring greater than 4 hours per night for optimal benefit.   DME [facility], continue to follow with DME company for any needed supplies or CPAP related concerns.  AutoPap set up 02/2021, due for new machine 02/2026     Orders Placed This Encounter  Procedures   For home use only DME continuous positive airway pressure (CPAP)    DME [facility]  Continue current pressure settings of 5-12 Continue to provide supplies as indicated    Length of Need:   Lifetime    Patient has OSA or probable OSA:   Yes    Is the patient currently using CPAP in the home:   Yes    If yes (to question two):   Determine DME provider and inform them of any new orders/settings    Date of face to face encounter:   12/10/2023    Settings:   Autotitration    CPAP supplies needed:   Mask, headgear, cushions, filters, heated tubing and water chamber    Additional  equipment included:   Heated humification and supplies     Follow up in 1 year via MyChart video visit or call earlier if needed      CC:  PCP: Shayne Anes, MD      Harlene Bogaert, AGNP-BC  [facility] [address] [place], [place] [address]  Phone [phone] Fax [phone] Note: This document was prepared with digital dictation and possible smart phrase technology. Any transcriptional errors  that result from this process are unintentional.

## 2023-12-10 ENCOUNTER — Encounter: Payer: Self-pay | Admitting: Adult Health

## 2023-12-10 ENCOUNTER — Telehealth: Payer: 59 | Admitting: Adult Health

## 2023-12-10 DIAGNOSIS — G4733 Obstructive sleep apnea (adult) (pediatric): Secondary | ICD-10-CM

## 2023-12-12 NOTE — Progress Notes (Signed)
 Toran Murch D, CMA  Joylene Bradley; Garcia, Patricia; Ziegler, Melissa; Tucker, Dolanda; Cain, Oakes New orders have been placed for the above pt, DOB: September 28, 1980 Thanks

## 2023-12-30 ENCOUNTER — Other Ambulatory Visit (HOSPITAL_BASED_OUTPATIENT_CLINIC_OR_DEPARTMENT_OTHER): Payer: Self-pay

## 2023-12-30 ENCOUNTER — Other Ambulatory Visit: Payer: Self-pay

## 2024-01-01 ENCOUNTER — Other Ambulatory Visit (HOSPITAL_BASED_OUTPATIENT_CLINIC_OR_DEPARTMENT_OTHER): Payer: Self-pay

## 2024-01-26 ENCOUNTER — Other Ambulatory Visit (HOSPITAL_COMMUNITY): Payer: Self-pay

## 2024-01-27 ENCOUNTER — Other Ambulatory Visit (HOSPITAL_COMMUNITY): Payer: Self-pay

## 2024-01-27 ENCOUNTER — Other Ambulatory Visit: Payer: Self-pay

## 2024-01-27 MED ORDER — SERTRALINE HCL 100 MG PO TABS
200.0000 mg | ORAL_TABLET | Freq: Every day | ORAL | 3 refills | Status: AC
  Filled 2024-01-27: qty 180, 90d supply, fill #0

## 2024-01-28 ENCOUNTER — Other Ambulatory Visit (HOSPITAL_COMMUNITY): Payer: Self-pay

## 2024-01-28 ENCOUNTER — Other Ambulatory Visit: Payer: Self-pay

## 2024-01-29 ENCOUNTER — Other Ambulatory Visit (HOSPITAL_BASED_OUTPATIENT_CLINIC_OR_DEPARTMENT_OTHER): Payer: Self-pay

## 2024-02-27 ENCOUNTER — Other Ambulatory Visit: Payer: Self-pay

## 2024-02-27 ENCOUNTER — Other Ambulatory Visit (HOSPITAL_BASED_OUTPATIENT_CLINIC_OR_DEPARTMENT_OTHER): Payer: Self-pay

## 2024-03-30 ENCOUNTER — Other Ambulatory Visit (HOSPITAL_BASED_OUTPATIENT_CLINIC_OR_DEPARTMENT_OTHER): Payer: Self-pay

## 2024-03-30 MED ORDER — TEMAZEPAM 15 MG PO CAPS
30.0000 mg | ORAL_CAPSULE | Freq: Every day | ORAL | 4 refills | Status: AC
  Filled 2024-03-30: qty 60, 30d supply, fill #0

## 2024-03-31 ENCOUNTER — Other Ambulatory Visit (HOSPITAL_BASED_OUTPATIENT_CLINIC_OR_DEPARTMENT_OTHER): Payer: Self-pay
